# Patient Record
Sex: Male | Born: 1965 | Race: White | Hispanic: No | Marital: Married | Smoking: Former smoker
Health system: Southern US, Community
[De-identification: ages and names within clinical notes are randomized; demographics above are authoritative.]

## PROBLEM LIST (undated history)

## (undated) DIAGNOSIS — F32A Depression, unspecified: Secondary | ICD-10-CM

## (undated) DIAGNOSIS — E785 Hyperlipidemia, unspecified: Secondary | ICD-10-CM

## (undated) DIAGNOSIS — N209 Urinary calculus, unspecified: Secondary | ICD-10-CM

## (undated) DIAGNOSIS — I82409 Acute embolism and thrombosis of unspecified deep veins of unspecified lower extremity: Secondary | ICD-10-CM

## (undated) DIAGNOSIS — Z86711 Personal history of pulmonary embolism: Secondary | ICD-10-CM

## (undated) DIAGNOSIS — K769 Liver disease, unspecified: Secondary | ICD-10-CM

## (undated) DIAGNOSIS — Z87442 Personal history of urinary calculi: Secondary | ICD-10-CM

## (undated) DIAGNOSIS — M1990 Unspecified osteoarthritis, unspecified site: Secondary | ICD-10-CM

## (undated) DIAGNOSIS — K219 Gastro-esophageal reflux disease without esophagitis: Secondary | ICD-10-CM

## (undated) DIAGNOSIS — I739 Peripheral vascular disease, unspecified: Secondary | ICD-10-CM

## (undated) DIAGNOSIS — F329 Major depressive disorder, single episode, unspecified: Secondary | ICD-10-CM

## (undated) DIAGNOSIS — Z8719 Personal history of other diseases of the digestive system: Secondary | ICD-10-CM

## (undated) DIAGNOSIS — I10 Essential (primary) hypertension: Secondary | ICD-10-CM

## (undated) HISTORY — DX: Personal history of urinary calculi: Z87.442

## (undated) HISTORY — DX: Essential (primary) hypertension: I10

## (undated) HISTORY — PX: HEMORRHOID SURGERY: SHX153

## (undated) HISTORY — DX: Major depressive disorder, single episode, unspecified: F32.9

## (undated) HISTORY — PX: VASECTOMY: SHX75

## (undated) HISTORY — PX: APPENDECTOMY: SHX54

## (undated) HISTORY — PX: NASAL SINUS SURGERY: SHX719

## (undated) HISTORY — DX: Personal history of pulmonary embolism: Z86.711

## (undated) HISTORY — DX: Hyperlipidemia, unspecified: E78.5

## (undated) HISTORY — PX: HIP FRACTURE SURGERY: SHX118

## (undated) HISTORY — PX: HERNIA REPAIR: SHX51

## (undated) HISTORY — PX: COLONOSCOPY W/ POLYPECTOMY: SHX1380

## (undated) HISTORY — DX: Depression, unspecified: F32.A

## (undated) HISTORY — DX: Acute embolism and thrombosis of unspecified deep veins of unspecified lower extremity: I82.409

## (undated) HISTORY — DX: Liver disease, unspecified: K76.9

---

## 2011-10-08 ENCOUNTER — Ambulatory Visit: Payer: 59 | Admitting: Physical Medicine & Rehabilitation

## 2011-10-08 ENCOUNTER — Encounter: Payer: 59 | Attending: Physical Medicine & Rehabilitation

## 2012-10-02 ENCOUNTER — Telehealth: Payer: Self-pay | Admitting: Internal Medicine

## 2012-10-02 NOTE — Telephone Encounter (Signed)
S/W Larita Fife Case Manager in re NP appt 12/17 @ 9:30 w/Dr. Arbutus Ped.  Referring Dr. Venita Lick Dx- Hx of PE Welcome packet mailed.

## 2012-10-13 ENCOUNTER — Encounter: Payer: Self-pay | Admitting: Vascular Surgery

## 2012-10-16 ENCOUNTER — Telehealth: Payer: Self-pay | Admitting: Internal Medicine

## 2012-10-16 NOTE — Telephone Encounter (Signed)
C/D 10/16/12 for appt.10/17/12

## 2012-10-17 ENCOUNTER — Ambulatory Visit: Payer: 59

## 2012-10-17 ENCOUNTER — Ambulatory Visit (HOSPITAL_BASED_OUTPATIENT_CLINIC_OR_DEPARTMENT_OTHER): Payer: 59 | Admitting: Internal Medicine

## 2012-10-17 ENCOUNTER — Other Ambulatory Visit (HOSPITAL_BASED_OUTPATIENT_CLINIC_OR_DEPARTMENT_OTHER): Payer: PRIVATE HEALTH INSURANCE

## 2012-10-17 VITALS — BP 142/94 | HR 82 | Temp 97.8°F | Resp 22 | Ht 70.5 in | Wt 242.9 lb

## 2012-10-17 DIAGNOSIS — Z86718 Personal history of other venous thrombosis and embolism: Secondary | ICD-10-CM

## 2012-10-17 DIAGNOSIS — I2699 Other pulmonary embolism without acute cor pulmonale: Secondary | ICD-10-CM

## 2012-10-17 DIAGNOSIS — Z7901 Long term (current) use of anticoagulants: Secondary | ICD-10-CM

## 2012-10-17 DIAGNOSIS — Z86711 Personal history of pulmonary embolism: Secondary | ICD-10-CM

## 2012-10-17 LAB — CBC WITH DIFFERENTIAL/PLATELET
Basophils Absolute: 0 10*3/uL (ref 0.0–0.1)
EOS%: 3.8 % (ref 0.0–7.0)
HGB: 15.4 g/dL (ref 13.0–17.1)
LYMPH%: 31.7 % (ref 14.0–49.0)
MCH: 29.5 pg (ref 27.2–33.4)
MCV: 86.2 fL (ref 79.3–98.0)
MONO%: 10.5 % (ref 0.0–14.0)
Platelets: 229 10*3/uL (ref 140–400)
RDW: 14.2 % (ref 11.0–14.6)

## 2012-10-17 LAB — COMPREHENSIVE METABOLIC PANEL (CC13)
AST: 21 U/L (ref 5–34)
BUN: 15 mg/dL (ref 7.0–26.0)
CO2: 27 mEq/L (ref 22–29)
Calcium: 9.5 mg/dL (ref 8.4–10.4)
Chloride: 100 mEq/L (ref 98–107)
Creatinine: 1 mg/dL (ref 0.7–1.3)
Glucose: 91 mg/dl (ref 70–99)

## 2012-10-17 LAB — PROTIME-INR: INR: 1.4 — ABNORMAL LOW (ref 2.00–3.50)

## 2012-10-17 NOTE — Patient Instructions (Signed)
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 Recommendation regarding anticoagulation before and after surgery will be sent to your primary care physician and your orthopedic surgeon. Followup with your primary care physician as previously scheduled.

## 2012-10-17 NOTE — Progress Notes (Signed)
[facility] Telephone:([phone]) [phone]   [phone]  CONSULT NOTE  REFERRING PHYSICIAN: Dr. Ninetta Lights.  REASON FOR CONSULTATION: Patient with history of pulmonary embolism for preoperative evaluation before surgical intervention  HPI Kent Jones is a 46 y.o. male was past medical history significant for kidney stones, liver disease, dyslipidemia, depression, hypertension and history of pulmonary embolism in February of 2010 after right hip surgery. The patient mentions that in early 2010 had an accident and fell from a high ladder during work. He had significant trauma to his back as well as the right hip with an acetabular fracture requiring an ORIF. After his hip surgery the patient developed deep venous thrombosis with pulmonary embolism. He was treated with Lovenox followed by Coumadin for more than 18 months. His Coumadin was discontinued but 2 weeks later the patient developed deep venous thrombosis again and he was started on Coumadin again. He is currently on 4.5 mg by mouth daily and his PT/INR is regulated by his primary care physician. The patient is expected to have surgery to decompress the L5-S1 by Dr. Shon Baton. He was referred to me today for evaluation and recommendation regarding anticoagulation before his surgery. His wife mentions that he had a hypercoagulable workup performed at [facility] and this was unremarkable for any genetic or acquired hypercoagulable abnormality but I don't have any record of it. It was decided after the recurrent DVT that the patient will continue on Coumadin for life. He is also expected to see Dr. Arbie Cookey for evaluation and consideration of IVC filter placement before his surgery because of the high risk of deep venous thrombosis and pulmonary emboli. He is scheduled to see him the end of December 2013. The patient is feeling fine today with no specific complaints except for back pain and he is currently on  oxycodone. He denied having any significant weight loss or night sweats. He has no chest pain, shortness breath, cough or hemoptysis. He has no swelling of his lower extremities.   @SFHPI @  Past Medical History  Diagnosis Date  . DVT (deep venous thrombosis)   . Depression   . Hypertension   . Hyperlipidemia   . History of kidney stones   . Liver disease   . Personal history of PE (pulmonary embolism)     Past Surgical History  Procedure Date  . Nasal sinus surgery   . Vasectomy   . Hip fracture surgery     Right acetabular fracture  . Appendectomy   . Colonoscopy w/ polypectomy   . Hemorrhoid surgery     Family History  Problem Relation Age of Onset  . Diabetes Mother   . COPD Father   . Heart disease Father   . Diabetes Father   . Liver disease Father     Social History History  Substance Use Topics  . Smoking status: Former Smoker    Quit date: 10/13/1997  . Smokeless tobacco: Current User    Types: Chew  . Alcohol Use: 3.0 oz/week    5 Cans of beer per week    Allergies no known allergies  Current Outpatient Prescriptions  Medication Sig Dispense Refill  . oxyCODONE (ROXICODONE) 15 MG immediate release tablet Take 15 mg by mouth 2 (two) times daily.      . rosuvastatin (CRESTOR) 10 MG tablet Take 10 mg by mouth daily.      Marland Kitchen venlafaxine (EFFEXOR) 75 MG tablet Take 75 mg by mouth 2 (two) times  daily.      . Warfarin Sodium (COUMADIN PO) Take by mouth.        Review of Systems  A comprehensive review of systems was negative except for: Musculoskeletal: positive for back pain  Physical Exam  ZOX:WRUEA, healthy, no distress, well nourished and well developed SKIN: skin color, texture, turgor are normal HEAD: Normocephalic, No masses, lesions, tenderness or abnormalities EYES: normal, PERRLA EARS: External ears normal OROPHARYNX:no exudate and no erythema  NECK: supple, no adenopathy LYMPH:  no palpable lymphadenopathy, no  hepatosplenomegaly LUNGS: clear to auscultation  HEART: regular rate & rhythm, no murmurs and no gallops ABDOMEN:abdomen soft and non-tender BACK: Mild tenderness to palpation in the lower back EXTREMITIES:no joint deformities, effusion, or inflammation, no edema, no skin discoloration, no clubbing  NEURO: alert & oriented x 3 with fluent speech, no focal motor/sensory deficits  PERFORMANCE STATUS: ECOG 1  LABORATORY DATA: Lab Results  Component Value Date   WBC 7.7 10/17/2012   HGB 15.4 10/17/2012   HCT 45.2 10/17/2012   MCV 86.2 10/17/2012   PLT 229 10/17/2012      Chemistry      Component Value Date/Time   NA 139 10/17/2012 0937   K 4.0 10/17/2012 0937   CL 100 10/17/2012 0937   CO2 27 10/17/2012 0937   BUN 15.0 10/17/2012 0937   CREATININE 1.0 10/17/2012 0937      Component Value Date/Time   CALCIUM 9.5 10/17/2012 0937   ALKPHOS 79 10/17/2012 0937   AST 21 10/17/2012 0937   ALT 30 10/17/2012 0937   BILITOT 0.66 10/17/2012 0937       RADIOGRAPHIC STUDIES: No results found.  ASSESSMENT: This is a very pleasant 46 years old white male with history of deep venous thrombosis as well as pulmonary emboli and currently on Coumadin for life because of the recurrent deep venous thrombosis. The patient is doing fine today with no specific complaints but his PTI/INR is subtherapeutic. He supposed to undergo back surgery in early January of 2004 and he is here today for recommendation regarding anticoagulation before and after his surgery.  PLAN: I had a lengthy discussion with the patient and his wife today about his condition. Although there is no indication to put the patient on Coumadin for life because of the absence of any hypercoagulable abnormality and his recurrent deep venous thrombosis happened when he was off Coumadin, but the patient is still at high risk for recurrent deep venous thrombosis and pulmonary emboli because of his extensive bone surgery.  I would agree  with continuing his anticoagulation for prolonged periods and probably for life unless IVC filter is placed which will not protect the patient from developing deep venous thrombosis but at least it will help preventing pulmonary embolus.  For the perioperative anticoagulation I would recommend to stop the Coumadin at least 5 days before the surgery and bridge the patient with Lovenox for 3 days before stopping 48 hours before the surgical procedure. The patient can resume his Lovenox within 24-48 hours after the procedure and Coumadin can be restarted one day later.  I discussed my recommendation with the patient and his wife. They are in agreement with the current plan. I gave the patient and his wife the time to ask questions and I answered them completely to their satisfaction. I don't see a need for the patient to continue routine followup visit with me at this point but will be happy to see him in the future if needed.  All questions were answered. The patient knows to call the clinic with any problems, questions or concerns. We can certainly see the patient much sooner if necessary.  Thank you so much for allowing me to participate in the care of Kent Jones. I will continue to follow up the patient with you and assist in his care.  I spent 30 minutes counseling the patient face to face. The total time spent in the appointment was 55 minutes.  Shawntia Mangal K. 10/17/2012, 11:15 AM

## 2012-10-30 ENCOUNTER — Encounter: Payer: Self-pay | Admitting: Vascular Surgery

## 2012-10-31 ENCOUNTER — Encounter: Payer: Self-pay | Admitting: Vascular Surgery

## 2012-10-31 ENCOUNTER — Ambulatory Visit (INDEPENDENT_AMBULATORY_CARE_PROVIDER_SITE_OTHER): Payer: PRIVATE HEALTH INSURANCE | Admitting: Vascular Surgery

## 2012-10-31 VITALS — BP 127/91 | HR 89 | Resp 18 | Ht 70.0 in | Wt 244.5 lb

## 2012-10-31 DIAGNOSIS — Z95828 Presence of other vascular implants and grafts: Secondary | ICD-10-CM | POA: Insufficient documentation

## 2012-10-31 DIAGNOSIS — Z86711 Personal history of pulmonary embolism: Secondary | ICD-10-CM | POA: Insufficient documentation

## 2012-10-31 NOTE — Progress Notes (Signed)
[facility]ist [facility]   Patient name: Kent Jones MRN: [id number] DOB: [birth year] Sex: male   Referred by: Shon Baton  Reason for referral:  Chief Complaint  Patient presents with  . New Evaluation    evaluation for filter placement,  history of PE    HISTORY OF PRESENT ILLNESS: The patient presents today for consideration of a vena cava filter placement prior to upcoming back surgery. He has a very complex prior history. He does have a back injury with recommendation for surgical correction. 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 I do not have his duplex from this time but he and his wife reports that he had bilateral DVT and also pulmonary embolus. He was continued on Coumadin for some time and then after discontinuation of his Coumadin had a second DVT in his left leg. This was her confirmed with duplex. Again I do not have these records for review. He has been told that he needs to be on lifelong Coumadin following this. He apparently did have an extensive hypercoagulable workup which did not show any specific factor causing his DVT and pulmonary embolus. I have reviewed his recent consult from hematology with Dr. Gwenyth Bouillon as well. He does not have any history of cardiac disease or premature atherosclerotic disease. He does not have a family history of premature atherosclerotic disease or DVT.  Past Medical History  Diagnosis Date  . DVT (deep venous thrombosis)   . Depression   . Hypertension   . Hyperlipidemia   . History of kidney stones   . Liver disease   . Personal history of PE (pulmonary embolism)     Past Surgical History  Procedure Date  . Nasal sinus surgery   . Vasectomy   . Hip fracture surgery     Right acetabular fracture  . Appendectomy   . Colonoscopy w/ polypectomy   . Hemorrhoid surgery     History   Social History  . Marital Status: Married    Spouse Name: N/A    Number of Children: N/A  . Years of  Education: N/A   Occupational History  . Not on file.   Social History Main Topics  . Smoking status: Former Smoker    Quit date: 10/13/1997  . Smokeless tobacco: Current User    Types: Chew  . Alcohol Use: 3.0 oz/week    5 Cans of beer per week  . Drug Use: No  . Sexually Active: Not on file   Other Topics Concern  . Not on file   Social History Narrative  . No narrative on file    Family History  Problem Relation Age of Onset  . Diabetes Mother   . COPD Father   . Heart disease Father   . Diabetes Father   . Liver disease Father   . Hyperlipidemia Father   . Hypertension Father   . Heart attack Father     Allergies as of 10/31/2012 - Review Complete 10/31/2012  Allergen Reaction Noted  . Penicillins Hives 10/31/2012    Current Outpatient Prescriptions on File Prior to Visit  Medication Sig Dispense Refill  . dexlansoprazole (DEXILANT) 60 MG capsule Take 60 mg by mouth daily.      Marland Kitchen oxyCODONE (ROXICODONE) 15 MG immediate release tablet Take 15 mg by mouth.       . rosuvastatin (CRESTOR) 10 MG tablet Take 10 mg by mouth daily.      . TESTOSTERONE IM  Inject into the muscle.      . Valsartan (DIOVAN PO) Take by mouth daily.      Marland Kitchen venlafaxine (EFFEXOR) 75 MG tablet Take 75 mg by mouth 2 (two) times daily.      . Warfarin Sodium (COUMADIN PO) Take by mouth.         REVIEW OF SYSTEMS:  Positives indicated with an "X"  CARDIOVASCULAR:  [ ]  chest pain   [ ]  chest pressure   [ ]  palpitations   [ ]  orthopnea   [x ] dyspnea on exertion   [ ]  claudication   [ ]  rest pain   xDVT   [ ]  phlebitis PULMONARY:   [ ]  productive cough   [ ]  asthma   [ ]  wheezing NEUROLOGIC:   [x ] weakness  [x ] paresthesias  [ ]  aphasia  [ ]  amaurosis  [ ]  dizziness HEMATOLOGIC:   [ ]  bleeding problems   [ ]  clotting disorders MUSCULOSKELETAL:  [ ]  joint pain   [ ]  joint swelling GASTROINTESTINAL: [x ]  blood in stool  [ ]   hematemesis GENITOURINARY:  [ ]   dysuria  [ ]    hematuria PSYCHIATRIC:  [x ] history of major depression INTEGUMENTARY:  [ ]  rashes  [ ]  ulcers CONSTITUTIONAL:  [ ]  fever   [ ]  chills  PHYSICAL EXAMINATION:  General: The patient is a well-nourished male, in no acute distress. Vital signs are BP 127/91  Pulse 89  Resp 18  Ht 5\' 10"  (1.778 m)  Wt 244 lb 8 oz (110.904 kg)  BMI 35.08 kg/m2 Pulmonary: There is a good air exchange bilaterally without wheezing or rales. Abdomen: Soft and non-tender with normal pitch bowel sounds. Musculoskeletal: There are no major deformities.  There is no significant extremity pain. Neurologic: No focal weakness or paresthesias are detected, Skin: There are no ulcer or rashes noted. Psychiatric: The patient has normal affect. Cardiovascular: There is a regular rate and rhythm without significant murmur appreciated. Pulse status 2+ radial and 2+ dorsalis pedis pulses bilaterally Lower surety show no evidence of changes of chronic venous hypertension    Impression and Plan:  Had a long discussion with the patient and his wife. Also I discussed this with his medical case manager following my discussion with the patient and his wife. I think there is no absolute indication for vena cava filter placement. This is similar to Dr. Sharlene Dory opinion that there is no absolute indication for lifelong Coumadin. Certainly is quite concerning with bilateral DVT and pulmonary embolus following hip surgery and subsequent DVT within several weeks of discontinuing his Coumadin. I feel he is at very high risk for DVT and potential pulmonary embolus around the time of any a prolonged immobility and prolonged surgery such as back surgery. I wouldn't recommend vena cava filter placement. I discussed the procedure including potential complications of vena caval occlusion and penetration of the cava with struts of the vena cava filter over time. I filled the benefit of a vena cava filter placement out way his risk. Regarding the  potential for removable filter versus lifelong filter, I would recommend permanent filter placement due to his ongoing risk for DVT and pulmonary embolus. He reports that he is to have a probable replacement of his right hip and 2-3 year time frame. We will coordinate this with Dr. Shon Baton. I feel that it would be acceptable to place the filter at the same time as his back surgery if Dr. Shon Baton cells  this is exceptable    Senita Corredor [facility] [place] Office: [phone]

## 2012-12-22 NOTE — H&P (Signed)
History of Present Illness The patient is a 47 year old male who presents today for follow up of their back. The patient is being followed for their central back pain. They are now 4 year(s) out from injury. The patient reports their current pain level to be 4 / 10. Note for "Follow-up back": discuss surgery  Subjective Transcription  He returns today for a preoperative evaluation. We have had an opportunity to review his 08-25-12 MRI which showed the ongoing spondylolisthesis at L5-S1. I did tell him that I did have the deposition with his attorney concerning the L5-S1 problem. At this point, he also has had an opportunity to meet with Dr. Arbie Cookey whom I have spoken to. Dr. Arbie Cookey feels as though permanent implantation of the filter device should be adequate and it can be done the same day as surgery.  Allergies No Known Allergies  Social History Marital status. married Living situation. live with spouse Illicit drug use. no Number of flights of stairs before winded. 2-3 Tobacco use. Former smoker. quit 15 years ago never smoker; uses 1 can(s) smokeless per week Tobacco / smoke exposure. no Pain Contract. yes Children. 3 Alcohol use. current drinker; drinks beer; less than 5 per week No alcohol use Current work status. disabled Exercise. Exercises weekly; does running / walking Drug/Alcohol Rehab (Previously). no Drug/Alcohol Rehab (Currently). no  Medication History Roxicodone  Past Surgical History Sinus Surgery Straighten Nasal Septum Vasectomy Hip Fracture and Surgery. right Appendectomy Colon Polyp Removal - Colonoscopy Hemorrhoidectomy  Other Problems Kidney Stone Liver disease Unspecified Diagnosis Hypercholesterolemia Blood Clot Depression High blood pressure  Objective Transcription  His clinical exam is essentially unchanged, he continues to have severe debilitating back,buttock, bilateral leg pain with the left side being  worse than the right. The left side does trace into the L5-S1 dermatome. He has difficulty with heel-toe ambulation because of his chronic pain. The neurological exam is somewhat diffuse trace weakness due to pelvic and femur fractures and chronic longstanding issues. The back pain is horrific with palpation and range of motion. Lungs are clear to auscultation. Heart RRR. Abdomen is soft and nontender. No history of incontinence of bowel and/or bladder.   Plans Transcription  At this point in time,I had a long discussion with the patient and his wife about surgical intervention. Dr. Arbie Cookey will do an implantation of a permanent MRI compatible filter and then he will be turned prone and he will do a posterior transforaminal lumbar interbody fusion of L5 to S1. There is a central disk protrusion at L4-5 but no significant instability.  As I indicated in my previous notes while there is some degenerative changes at L4-5 I do not think it is enough to warrant incorporation into the surgical plan. I will plan on doing the L5-S1 TLIF. He will be in the hospital for 3-5 days. He will require a walker or a cane, a three in one shower chair and a bedside commode. He may require a home health service (PT/ nurse) depending upon how he does during his hospitalization. My hope is that by 4-5 months post operative we may be able to entertain light duties. However, his overall success rate is somewhat guarded due to the multiple other issues we are dealing with, specifically the degenerative hip disease, his previous acetabular fracture as well as the hip fracture that he had. I do think he has a good chance of getting reduced pain control and an improved quality of life as a result of the  back operation. All of their questions were encouraged and addressed. We have also gone over the risks to include infection, bleeding, nerve damage, death, stroke, paralysis, bleeding, blood clots, leak of spinal fluid, need for further  surgery, adjacent segment degeneration.  Angelisse Riso D. Shon Baton, MD  Clearance obtained from Dr Kristen Loader

## 2012-12-27 ENCOUNTER — Other Ambulatory Visit: Payer: Self-pay

## 2013-01-01 ENCOUNTER — Encounter (HOSPITAL_COMMUNITY): Payer: Self-pay | Admitting: Pharmacy Technician

## 2013-01-03 ENCOUNTER — Encounter (HOSPITAL_COMMUNITY)
Admission: RE | Admit: 2013-01-03 | Discharge: 2013-01-03 | Disposition: A | Discharge status: Home / Self Care | Payer: Worker's Compensation | Source: Ambulatory Visit | Attending: Orthopedic Surgery | Admitting: Orthopedic Surgery

## 2013-01-03 ENCOUNTER — Encounter (HOSPITAL_COMMUNITY)
Admission: RE | Admit: 2013-01-03 | Discharge: 2013-01-03 | Disposition: A | Discharge status: Home / Self Care | Payer: Worker's Compensation | Source: Ambulatory Visit | Attending: Anesthesiology | Admitting: Anesthesiology

## 2013-01-03 ENCOUNTER — Encounter (HOSPITAL_COMMUNITY): Payer: Self-pay

## 2013-01-03 HISTORY — DX: Gastro-esophageal reflux disease without esophagitis: K21.9

## 2013-01-03 HISTORY — DX: Unspecified osteoarthritis, unspecified site: M19.90

## 2013-01-03 HISTORY — DX: Peripheral vascular disease, unspecified: I73.9

## 2013-01-03 HISTORY — DX: Urinary calculus, unspecified: N20.9

## 2013-01-03 HISTORY — DX: Personal history of other diseases of the digestive system: Z87.19

## 2013-01-03 LAB — CBC
MCH: 29.4 pg (ref 26.0–34.0)
MCHC: 35.1 g/dL (ref 30.0–36.0)
Platelets: 261 10*3/uL (ref 150–400)
RDW: 14.2 % (ref 11.5–15.5)

## 2013-01-03 LAB — HEPATIC FUNCTION PANEL
ALT: 49 U/L (ref 0–53)
AST: 37 U/L (ref 0–37)
Albumin: 4 g/dL (ref 3.5–5.2)
Alkaline Phosphatase: 64 U/L (ref 39–117)
Total Bilirubin: 0.4 mg/dL (ref 0.3–1.2)

## 2013-01-03 LAB — BASIC METABOLIC PANEL
BUN: 12 mg/dL (ref 6–23)
Calcium: 9.9 mg/dL (ref 8.4–10.5)
Creatinine, Ser: 0.88 mg/dL (ref 0.50–1.35)
GFR calc non Af Amer: >90 mL/min (ref >90)
Glucose, Bld: 97 mg/dL (ref 70–99)

## 2013-01-03 LAB — TYPE AND SCREEN: Antibody Screen: NEGATIVE

## 2013-01-03 LAB — PROTIME-INR: Prothrombin Time: 27.1 seconds — ABNORMAL HIGH (ref 11.6–15.2)

## 2013-01-03 NOTE — Progress Notes (Signed)
01/03/13 0947  OBSTRUCTIVE SLEEP APNEA  Score 4 or greater  Results sent to PCP

## 2013-01-03 NOTE — Progress Notes (Signed)
Consult with [employer] pa

## 2013-01-03 NOTE — Progress Notes (Signed)
Anesthesia PAT evaluation:  Patient is a 47 year old male scheduled for TLIF L5-S1 by Dr. Shon Baton on 01/10/13.  He is scheduled to have insertion of a permanent MRI compatible IVC filter in the OR by Dr. Arbie Cookey just prior to lumbar fusion.  (I confirmed this with Dr. Arbie Cookey and Dr. Shon Baton when I ran into them in the hallway today.)  History includes bilateral LE DVT and PE in 2010 following hip surgery for fracture (surgery done at [facility], but admission for DVT/PE was at [facility]), recurrent DVT when off Coumadin, former smoker, obesity, GERD, hiatal hernia, HTN, nephrolithiasis, depression, HLD, nasal sinus surgery, arthritis, and history of elevated LFTs in the past but no known diagnosis of hepatitis (they were WNL in 10/2012).  PCP is Dr. Ninetta Lights in [place], who has reportedly cleared patient for this procedure (note requested).  Dr. Shon Baton also had patient evaluated by Hematologist Dr. Arbutus Ped and Vascular Surgeon Dr. Arbie Cookey prior to scheduling surgery (see their notes in Epic).   EKG on 01/03/13 showed NSR, minimal voltage criteria for LVH.  Patient does not recall any history of prior cardiac testing such as stress or echo.  CXR on 01/03/13 showed: 1. Low lung volumes without radiographic evidence of acute cardiopulmonary disease.  Preoperative labs noted.  Plan to repeat PT/PTT on arrival.  LFTs WNL. Dr. Shon Baton aware of INR results and is planning to have his staff instruct paitent to hold his Coumadin following a half dose on 01/05/13.  Dr. Shon Baton plans to use a Lovenox bridge post-operatively.  Velna Ochs [facility]/Anesthesiology Phone [phone] 01/03/2013 3:34 PM

## 2013-01-03 NOTE — Pre-Procedure Instructions (Addendum)
Kent Jones.  01/03/2013   Your procedure is scheduled on:  01/10/13  Report to [facility] at 630 AM.  Call this number if you have problems the morning of surgery: [phone]   Remember:   Do not eat food or drink liquids after midnight.   Take these medicines the morning of surgery with A SIP OF WATER: dexilant, pain med, effexor  STOP coumadin per dr 01/06/13   Do not wear jewelry, make-up or nail polish.  Do not wear lotions, powders, or perfumes. You may wear deodorant.  Do not shave 48 hours prior to surgery. Men may shave face and neck.  Do not bring valuables to the hospital.  Contacts, dentures or bridgework may not be worn into surgery.  Leave suitcase in the car. After surgery it may be brought to your room.  For patients admitted to the hospital, checkout time is 11:00 AM the day of  discharge.   Patients discharged the day of surgery will not be allowed to drive  home.  Name and phone number of your driver:   Special Instructions: Shower using CHG 2 nights before surgery and the night before surgery.  If you shower the day of surgery use CHG.  Use special wash - you have one bottle of CHG for all showers.  You should use approximately 1/3 of the bottle for each shower.   Please read over the following fact sheets that you were given: Pain Booklet, Coughing and Deep Breathing, Blood Transfusion Information, MRSA Information and Surgical Site Infection Prevention

## 2013-01-09 MED ORDER — VANCOMYCIN HCL 10 G IV SOLR
1500.0000 mg | INTRAVENOUS | Status: AC
  Administered 2013-01-10: 1500 mg via INTRAVENOUS
  Filled 2013-01-09: qty 1500

## 2013-01-10 ENCOUNTER — Inpatient Hospital Stay (HOSPITAL_COMMUNITY): Payer: Worker's Compensation

## 2013-01-10 ENCOUNTER — Encounter (HOSPITAL_COMMUNITY)
Admission: RE | Disposition: A | Discharge status: Home Health | Payer: Self-pay | Source: Ambulatory Visit | Attending: Orthopedic Surgery

## 2013-01-10 ENCOUNTER — Encounter (HOSPITAL_COMMUNITY): Payer: Self-pay | Admitting: *Deleted

## 2013-01-10 ENCOUNTER — Encounter (HOSPITAL_COMMUNITY): Payer: Self-pay | Admitting: Vascular Surgery

## 2013-01-10 ENCOUNTER — Inpatient Hospital Stay (HOSPITAL_COMMUNITY)
Admission: RE | Admit: 2013-01-10 | Discharge: 2013-01-13 | DRG: 460 | Disposition: A | Discharge status: Home Health | Payer: Worker's Compensation | Source: Ambulatory Visit | Attending: Orthopedic Surgery | Admitting: Orthopedic Surgery

## 2013-01-10 ENCOUNTER — Ambulatory Visit (HOSPITAL_COMMUNITY): Payer: Worker's Compensation | Admitting: Anesthesiology

## 2013-01-10 DIAGNOSIS — I10 Essential (primary) hypertension: Secondary | ICD-10-CM | POA: Diagnosis present

## 2013-01-10 DIAGNOSIS — I8010 Phlebitis and thrombophlebitis of unspecified femoral vein: Secondary | ICD-10-CM

## 2013-01-10 DIAGNOSIS — Z79899 Other long term (current) drug therapy: Secondary | ICD-10-CM

## 2013-01-10 DIAGNOSIS — K219 Gastro-esophageal reflux disease without esophagitis: Secondary | ICD-10-CM | POA: Diagnosis present

## 2013-01-10 DIAGNOSIS — Z01818 Encounter for other preprocedural examination: Secondary | ICD-10-CM

## 2013-01-10 DIAGNOSIS — M51379 Other intervertebral disc degeneration, lumbosacral region without mention of lumbar back pain or lower extremity pain: Secondary | ICD-10-CM | POA: Diagnosis present

## 2013-01-10 DIAGNOSIS — Z86711 Personal history of pulmonary embolism: Secondary | ICD-10-CM

## 2013-01-10 DIAGNOSIS — Q762 Congenital spondylolisthesis: Principal | ICD-10-CM

## 2013-01-10 DIAGNOSIS — F329 Major depressive disorder, single episode, unspecified: Secondary | ICD-10-CM | POA: Diagnosis present

## 2013-01-10 DIAGNOSIS — Z87442 Personal history of urinary calculi: Secondary | ICD-10-CM

## 2013-01-10 DIAGNOSIS — Z01812 Encounter for preprocedural laboratory examination: Secondary | ICD-10-CM

## 2013-01-10 DIAGNOSIS — M5416 Radiculopathy, lumbar region: Secondary | ICD-10-CM

## 2013-01-10 DIAGNOSIS — E7800 Pure hypercholesterolemia, unspecified: Secondary | ICD-10-CM | POA: Diagnosis present

## 2013-01-10 DIAGNOSIS — F17200 Nicotine dependence, unspecified, uncomplicated: Secondary | ICD-10-CM | POA: Diagnosis present

## 2013-01-10 DIAGNOSIS — M5137 Other intervertebral disc degeneration, lumbosacral region: Secondary | ICD-10-CM | POA: Diagnosis present

## 2013-01-10 DIAGNOSIS — I739 Peripheral vascular disease, unspecified: Secondary | ICD-10-CM | POA: Diagnosis present

## 2013-01-10 DIAGNOSIS — K449 Diaphragmatic hernia without obstruction or gangrene: Secondary | ICD-10-CM | POA: Diagnosis present

## 2013-01-10 DIAGNOSIS — Z7901 Long term (current) use of anticoagulants: Secondary | ICD-10-CM

## 2013-01-10 DIAGNOSIS — Z86718 Personal history of other venous thrombosis and embolism: Secondary | ICD-10-CM

## 2013-01-10 DIAGNOSIS — F3289 Other specified depressive episodes: Secondary | ICD-10-CM | POA: Diagnosis present

## 2013-01-10 HISTORY — PX: VENA CAVA FILTER PLACEMENT: SHX1085

## 2013-01-10 LAB — CREATININE, SERUM
GFR calc Af Amer: >90 mL/min (ref >90)
GFR calc non Af Amer: >90 mL/min (ref >90)

## 2013-01-10 LAB — CBC
MCV: 82.5 fL (ref 78.0–100.0)
Platelets: 198 10*3/uL (ref 150–400)
RBC: 4.28 MIL/uL (ref 4.22–5.81)
RDW: 13.7 % (ref 11.5–15.5)
WBC: 10.5 10*3/uL (ref 4.0–10.5)

## 2013-01-10 SURGERY — POSTERIOR LUMBAR FUSION 1 LEVEL
Anesthesia: General | Site: Spine Lumbar | Wound class: Clean

## 2013-01-10 MED ORDER — METHOCARBAMOL 500 MG PO TABS
500.0000 mg | ORAL_TABLET | Freq: Four times a day (QID) | ORAL | Status: DC | PRN
  Administered 2013-01-10 – 2013-01-12 (×6): 500 mg via ORAL
  Filled 2013-01-10 (×5): qty 1

## 2013-01-10 MED ORDER — IOHEXOL 300 MG/ML  SOLN
INTRAMUSCULAR | Status: DC | PRN
  Administered 2013-01-10: 50 mL via INTRA_ARTERIAL

## 2013-01-10 MED ORDER — WARFARIN SODIUM 7.5 MG PO TABS
7.5000 mg | ORAL_TABLET | Freq: Once | ORAL | Status: DC
  Filled 2013-01-10: qty 1

## 2013-01-10 MED ORDER — OXYCODONE HCL 5 MG PO TABS
ORAL_TABLET | ORAL | Status: AC
  Filled 2013-01-10: qty 2

## 2013-01-10 MED ORDER — METHOCARBAMOL 500 MG PO TABS
ORAL_TABLET | ORAL | Status: AC
  Filled 2013-01-10: qty 1

## 2013-01-10 MED ORDER — DIPHENHYDRAMINE HCL 12.5 MG/5ML PO ELIX: 12.5000 mg | ORAL_SOLUTION | Freq: Four times a day (QID) | ORAL | Status: DC | PRN

## 2013-01-10 MED ORDER — MIDAZOLAM HCL 5 MG/5ML IJ SOLN
INTRAMUSCULAR | Status: DC | PRN
  Administered 2013-01-10: 2 mg via INTRAVENOUS

## 2013-01-10 MED ORDER — NALOXONE HCL 0.4 MG/ML IJ SOLN: 0.4000 mg | INTRAMUSCULAR | Status: DC | PRN

## 2013-01-10 MED ORDER — ACETAMINOPHEN 10 MG/ML IV SOLN
1000.0000 mg | Freq: Four times a day (QID) | INTRAVENOUS | Status: DC
  Administered 2013-01-10: 1000 mg via INTRAVENOUS
  Filled 2013-01-10 (×3): qty 100

## 2013-01-10 MED ORDER — MORPHINE SULFATE 2 MG/ML IJ SOLN: 1.0000 mg | INTRAMUSCULAR | Status: DC | PRN

## 2013-01-10 MED ORDER — ONDANSETRON HCL 4 MG/2ML IJ SOLN: 4.0000 mg | INTRAMUSCULAR | Status: DC | PRN

## 2013-01-10 MED ORDER — PANTOPRAZOLE SODIUM 40 MG PO TBEC
40.0000 mg | DELAYED_RELEASE_TABLET | Freq: Every day | ORAL | Status: DC
  Administered 2013-01-10 – 2013-01-13 (×4): 40 mg via ORAL
  Filled 2013-01-10 (×4): qty 1

## 2013-01-10 MED ORDER — DEXAMETHASONE SODIUM PHOSPHATE 4 MG/ML IJ SOLN
4.0000 mg | Freq: Four times a day (QID) | INTRAMUSCULAR | Status: DC
  Filled 2013-01-10 (×15): qty 1

## 2013-01-10 MED ORDER — ACETAMINOPHEN 10 MG/ML IV SOLN
1000.0000 mg | Freq: Once | INTRAVENOUS | Status: AC
  Administered 2013-01-10: 1000 mg via INTRAVENOUS
  Filled 2013-01-10: qty 100

## 2013-01-10 MED ORDER — ONDANSETRON HCL 4 MG/2ML IJ SOLN: 4.0000 mg | Freq: Four times a day (QID) | INTRAMUSCULAR | Status: DC | PRN

## 2013-01-10 MED ORDER — HEMOSTATIC AGENTS (NO CHARGE) OPTIME
TOPICAL | Status: DC | PRN
  Administered 2013-01-10: 1 via TOPICAL

## 2013-01-10 MED ORDER — VENLAFAXINE HCL 75 MG PO TABS
75.0000 mg | ORAL_TABLET | Freq: Two times a day (BID) | ORAL | Status: DC
Start: 2013-01-10 — End: 2013-01-13
  Administered 2013-01-10 – 2013-01-13 (×6): 75 mg via ORAL
  Filled 2013-01-10 (×7): qty 1

## 2013-01-10 MED ORDER — DIPHENHYDRAMINE HCL 50 MG/ML IJ SOLN: 12.5000 mg | Freq: Four times a day (QID) | INTRAMUSCULAR | Status: DC | PRN

## 2013-01-10 MED ORDER — HYDROMORPHONE HCL PF 1 MG/ML IJ SOLN
INTRAMUSCULAR | Status: AC
  Filled 2013-01-10: qty 1

## 2013-01-10 MED ORDER — PROPOFOL INFUSION 10 MG/ML OPTIME
INTRAVENOUS | Status: DC | PRN
  Administered 2013-01-10: 75 ug/kg/min via INTRAVENOUS

## 2013-01-10 MED ORDER — BUPIVACAINE-EPINEPHRINE PF 0.25-1:200000 % IJ SOLN
INTRAMUSCULAR | Status: DC | PRN
  Administered 2013-01-10: 10 mL

## 2013-01-10 MED ORDER — SUCCINYLCHOLINE CHLORIDE 20 MG/ML IJ SOLN
INTRAMUSCULAR | Status: DC | PRN
  Administered 2013-01-10: 120 mg via INTRAVENOUS

## 2013-01-10 MED ORDER — SODIUM CHLORIDE 0.9 % IR SOLN
Status: DC | PRN
  Administered 2013-01-10: 09:00:00

## 2013-01-10 MED ORDER — HYDROMORPHONE 0.3 MG/ML IV SOLN
INTRAVENOUS | Status: DC
  Administered 2013-01-10: 18:00:00 via INTRAVENOUS
  Administered 2013-01-10: 4.99 mg via INTRAVENOUS
  Administered 2013-01-11: 0.3 mg via INTRAVENOUS
  Administered 2013-01-11: 1.79 mg via INTRAVENOUS
  Administered 2013-01-11: 01:00:00 via INTRAVENOUS
  Filled 2013-01-10 (×3): qty 25

## 2013-01-10 MED ORDER — PROPOFOL INFUSION 10 MG/ML OPTIME: INTRAVENOUS | Status: DC | PRN

## 2013-01-10 MED ORDER — THROMBIN 20000 UNITS EX SOLR
CUTANEOUS | Status: AC
  Filled 2013-01-10: qty 20000

## 2013-01-10 MED ORDER — PHENYLEPHRINE HCL 10 MG/ML IJ SOLN
INTRAMUSCULAR | Status: DC | PRN
  Administered 2013-01-10: 120 ug via INTRAVENOUS
  Administered 2013-01-10: 80 ug via INTRAVENOUS

## 2013-01-10 MED ORDER — SODIUM CHLORIDE 0.9 % IJ SOLN: 3.0000 mL | INTRAMUSCULAR | Status: DC | PRN

## 2013-01-10 MED ORDER — HYDROMORPHONE HCL PF 1 MG/ML IJ SOLN
0.2500 mg | INTRAMUSCULAR | Status: DC | PRN
  Administered 2013-01-10 (×3): 0.5 mg via INTRAVENOUS

## 2013-01-10 MED ORDER — OXYCODONE HCL 5 MG PO TABS
10.0000 mg | ORAL_TABLET | ORAL | Status: DC | PRN
  Administered 2013-01-10 – 2013-01-11 (×2): 10 mg via ORAL
  Filled 2013-01-10: qty 2

## 2013-01-10 MED ORDER — MENTHOL 3 MG MT LOZG: 1.0000 | LOZENGE | OROMUCOSAL | Status: DC | PRN

## 2013-01-10 MED ORDER — PROPOFOL 10 MG/ML IV BOLUS
INTRAVENOUS | Status: DC | PRN
  Administered 2013-01-10: 200 mg via INTRAVENOUS

## 2013-01-10 MED ORDER — SODIUM CHLORIDE 0.9 % IV SOLN: 250.0000 mL | INTRAVENOUS | Status: DC

## 2013-01-10 MED ORDER — PHENOL 1.4 % MT LIQD: 1.0000 | OROMUCOSAL | Status: DC | PRN

## 2013-01-10 MED ORDER — OXYCODONE HCL 5 MG PO TABS: 5.0000 mg | ORAL_TABLET | Freq: Once | ORAL | Status: DC | PRN

## 2013-01-10 MED ORDER — SODIUM CHLORIDE 0.9 % IJ SOLN: 9.0000 mL | INTRAMUSCULAR | Status: DC | PRN

## 2013-01-10 MED ORDER — DEXAMETHASONE 4 MG PO TABS
4.0000 mg | ORAL_TABLET | Freq: Four times a day (QID) | ORAL | Status: DC
  Administered 2013-01-10 – 2013-01-13 (×11): 4 mg via ORAL
  Filled 2013-01-10 (×15): qty 1

## 2013-01-10 MED ORDER — ENOXAPARIN SODIUM 30 MG/0.3ML ~~LOC~~ SOLN
30.0000 mg | SUBCUTANEOUS | Status: DC
  Filled 2013-01-10: qty 0.3

## 2013-01-10 MED ORDER — WARFARIN SODIUM 5 MG PO TABS: 5.0000 mg | ORAL_TABLET | Freq: Every day | ORAL | Status: DC

## 2013-01-10 MED ORDER — ZOLPIDEM TARTRATE 5 MG PO TABS
5.0000 mg | ORAL_TABLET | Freq: Every evening | ORAL | Status: DC | PRN
  Administered 2013-01-11 – 2013-01-12 (×2): 5 mg via ORAL
  Filled 2013-01-10 (×2): qty 1

## 2013-01-10 MED ORDER — ATORVASTATIN CALCIUM 10 MG PO TABS
10.0000 mg | ORAL_TABLET | Freq: Every day | ORAL | Status: DC
  Administered 2013-01-10 – 2013-01-12 (×3): 10 mg via ORAL
  Filled 2013-01-10 (×4): qty 1

## 2013-01-10 MED ORDER — WARFARIN - PHYSICIAN DOSING INPATIENT: Freq: Every day | Status: DC

## 2013-01-10 MED ORDER — DEXAMETHASONE SODIUM PHOSPHATE 4 MG/ML IJ SOLN
4.0000 mg | Freq: Once | INTRAMUSCULAR | Status: AC
  Administered 2013-01-10: 4 mg via INTRAVENOUS
  Filled 2013-01-10 (×2): qty 1

## 2013-01-10 MED ORDER — LIDOCAINE HCL (CARDIAC) 20 MG/ML IV SOLN
INTRAVENOUS | Status: DC | PRN
  Administered 2013-01-10: 100 mg via INTRAVENOUS

## 2013-01-10 MED ORDER — ARTIFICIAL TEARS OP OINT
TOPICAL_OINTMENT | OPHTHALMIC | Status: DC | PRN
  Administered 2013-01-10: 1 via OPHTHALMIC

## 2013-01-10 MED ORDER — PHENYLEPHRINE HCL 10 MG/ML IJ SOLN
10.0000 mg | INTRAVENOUS | Status: DC | PRN
  Administered 2013-01-10: 40 ug/min via INTRAVENOUS

## 2013-01-10 MED ORDER — THROMBIN 20000 UNITS EX SOLR
CUTANEOUS | Status: DC | PRN
  Administered 2013-01-10: 10:00:00 via TOPICAL

## 2013-01-10 MED ORDER — SODIUM CHLORIDE 0.9 % IV SOLN
INTRAVENOUS | Status: DC | PRN
  Administered 2013-01-10: 09:00:00 via INTRAVENOUS

## 2013-01-10 MED ORDER — BUPIVACAINE-EPINEPHRINE 0.25% -1:200000 IJ SOLN
INTRAMUSCULAR | Status: AC
  Filled 2013-01-10: qty 1

## 2013-01-10 MED ORDER — METHOCARBAMOL 100 MG/ML IJ SOLN
500.0000 mg | Freq: Four times a day (QID) | INTRAMUSCULAR | Status: DC | PRN
  Filled 2013-01-10: qty 5

## 2013-01-10 MED ORDER — METOCLOPRAMIDE HCL 5 MG/ML IJ SOLN
INTRAMUSCULAR | Status: DC | PRN
  Administered 2013-01-10: 10 mg via INTRAVENOUS

## 2013-01-10 MED ORDER — MORPHINE SULFATE (PF) 1 MG/ML IV SOLN
INTRAVENOUS | Status: AC
  Filled 2013-01-10: qty 25

## 2013-01-10 MED ORDER — ENOXAPARIN SODIUM 120 MG/0.8ML ~~LOC~~ SOLN
110.0000 mg | Freq: Two times a day (BID) | SUBCUTANEOUS | Status: DC
  Filled 2013-01-10 (×2): qty 0.8

## 2013-01-10 MED ORDER — MORPHINE SULFATE (PF) 1 MG/ML IV SOLN
INTRAVENOUS | Status: DC
  Administered 2013-01-10: 15:00:00 via INTRAVENOUS

## 2013-01-10 MED ORDER — LACTATED RINGERS IV SOLN
INTRAVENOUS | Status: DC
  Administered 2013-01-10 (×2): via INTRAVENOUS

## 2013-01-10 MED ORDER — LACTATED RINGERS IV SOLN
INTRAVENOUS | Status: DC | PRN
  Administered 2013-01-10 (×4): via INTRAVENOUS

## 2013-01-10 MED ORDER — SODIUM CHLORIDE 0.9 % IJ SOLN
3.0000 mL | Freq: Two times a day (BID) | INTRAMUSCULAR | Status: DC
  Administered 2013-01-10 – 2013-01-12 (×3): 3 mL via INTRAVENOUS

## 2013-01-10 MED ORDER — LIDOCAINE HCL 4 % MT SOLN
OROMUCOSAL | Status: DC | PRN
  Administered 2013-01-10: 4 mL via TOPICAL

## 2013-01-10 MED ORDER — OXYCODONE HCL 5 MG/5ML PO SOLN: 5.0000 mg | Freq: Once | ORAL | Status: DC | PRN

## 2013-01-10 MED ORDER — SUFENTANIL CITRATE 50 MCG/ML IV SOLN
INTRAVENOUS | Status: DC | PRN
  Administered 2013-01-10 (×3): 10 ug via INTRAVENOUS
  Administered 2013-01-10: 35 ug via INTRAVENOUS
  Administered 2013-01-10 (×3): 10 ug via INTRAVENOUS
  Administered 2013-01-10: 15 ug via INTRAVENOUS
  Administered 2013-01-10: 5 ug via INTRAVENOUS
  Administered 2013-01-10: 10 ug via INTRAVENOUS
  Administered 2013-01-10: 5 ug via INTRAVENOUS

## 2013-01-10 MED ORDER — 0.9 % SODIUM CHLORIDE (POUR BTL) OPTIME
TOPICAL | Status: DC | PRN
  Administered 2013-01-10 (×3): 1000 mL

## 2013-01-10 MED ORDER — METOCLOPRAMIDE HCL 5 MG/ML IJ SOLN: 10.0000 mg | Freq: Once | INTRAMUSCULAR | Status: DC | PRN

## 2013-01-10 SURGICAL SUPPLY — 110 items
BAG DECANTER FOR FLEXI CONT (MISCELLANEOUS) ×3 IMPLANT
BLADE SURG 11 STRL SS (BLADE) ×3 IMPLANT
BLADE SURG ROTATE 9660 (MISCELLANEOUS) ×3 IMPLANT
BUR EGG ELITE 4.0 (BURR) ×3 IMPLANT
CAGE TLIF XLRG 9 LUMBAR (Cage) ×3 IMPLANT
CLIP LIGATING EXTRA MED SLVR (CLIP) IMPLANT
CLIP LIGATING EXTRA SM BLUE (MISCELLANEOUS) IMPLANT
CLOTH BEACON ORANGE TIMEOUT ST (SAFETY) ×6 IMPLANT
CLSR STERI-STRIP ANTIMIC 1/2X4 (GAUZE/BANDAGES/DRESSINGS) ×3 IMPLANT
CORDS BIPOLAR (ELECTRODE) ×3 IMPLANT
COVER MAYO STAND STRL (DRAPES) ×6 IMPLANT
COVER PROBE W GEL 5X96 (DRAPES) ×3 IMPLANT
COVER SURGICAL LIGHT HANDLE (MISCELLANEOUS) ×6 IMPLANT
COVER TABLE BACK 60X90 (DRAPES) ×3 IMPLANT
DERMABOND ADVANCED (GAUZE/BANDAGES/DRESSINGS) ×1
DERMABOND ADVANCED .7 DNX12 (GAUZE/BANDAGES/DRESSINGS) ×2 IMPLANT
DRAPE C-ARM 42X72 X-RAY (DRAPES) ×6 IMPLANT
DRAPE CHEST BREAST 15X10 FENES (DRAPES) ×3 IMPLANT
DRAPE LAPAROTOMY T 102X78X121 (DRAPES) ×3 IMPLANT
DRAPE ORTHO SPLIT 77X108 STRL (DRAPES) ×1
DRAPE POUCH INSTRU U-SHP 10X18 (DRAPES) ×3 IMPLANT
DRAPE SURG 17X23 STRL (DRAPES) ×3 IMPLANT
DRAPE SURG ORHT 6 SPLT 77X108 (DRAPES) ×2 IMPLANT
DRAPE U-SHAPE 47X51 STRL (DRAPES) ×3 IMPLANT
DRSG MEPILEX BORDER 4X4 (GAUZE/BANDAGES/DRESSINGS) ×6 IMPLANT
DRSG MEPILEX BORDER 4X8 (GAUZE/BANDAGES/DRESSINGS) ×3 IMPLANT
DURAPREP 26ML APPLICATOR (WOUND CARE) ×3 IMPLANT
ELECT BLADE 4.0 EZ CLEAN MEGAD (MISCELLANEOUS)
ELECT BLADE 6.5 EXT (BLADE) ×3 IMPLANT
ELECT REM PT RETURN 9FT ADLT (ELECTROSURGICAL) ×3
ELECTRODE BLDE 4.0 EZ CLN MEGD (MISCELLANEOUS) IMPLANT
ELECTRODE REM PT RTRN 9FT ADLT (ELECTROSURGICAL) ×2 IMPLANT
FILTER VC CELECT-FEMORAL (Filter) ×3 IMPLANT
GAUZE SPONGE 4X4 16PLY XRAY LF (GAUZE/BANDAGES/DRESSINGS) IMPLANT
GLOVE BIO SURGEON STRL SZ7.5 (GLOVE) ×3 IMPLANT
GLOVE BIOGEL PI IND STRL 6.5 (GLOVE) ×6 IMPLANT
GLOVE BIOGEL PI IND STRL 7.0 (GLOVE) ×2 IMPLANT
GLOVE BIOGEL PI IND STRL 7.5 (GLOVE) ×2 IMPLANT
GLOVE BIOGEL PI IND STRL 8.5 (GLOVE) ×2 IMPLANT
GLOVE BIOGEL PI INDICATOR 6.5 (GLOVE) ×3
GLOVE BIOGEL PI INDICATOR 7.0 (GLOVE) ×1
GLOVE BIOGEL PI INDICATOR 7.5 (GLOVE) ×1
GLOVE BIOGEL PI INDICATOR 8.5 (GLOVE) ×1
GLOVE ECLIPSE 6.0 STRL STRAW (GLOVE) ×3 IMPLANT
GLOVE ECLIPSE 6.5 STRL STRAW (GLOVE) ×3 IMPLANT
GLOVE ECLIPSE 7.5 STRL STRAW (GLOVE) ×3 IMPLANT
GLOVE ECLIPSE 8.5 STRL (GLOVE) ×3 IMPLANT
GLOVE SS BIOGEL STRL SZ 7.5 (GLOVE) ×2 IMPLANT
GLOVE SUPERSENSE BIOGEL SZ 7.5 (GLOVE) ×1
GLOVE SURG SS PI 7.0 STRL IVOR (GLOVE) ×3 IMPLANT
GOWN PREVENTION PLUS XXLARGE (GOWN DISPOSABLE) ×3 IMPLANT
GOWN STRL NON-REIN LRG LVL3 (GOWN DISPOSABLE) ×6 IMPLANT
GUIDEWIRE 1.6X480MM (WIRE) ×6 IMPLANT
IV CATH 14GX2 1/4 (CATHETERS) IMPLANT
KIT BASIN OR (CUSTOM PROCEDURE TRAY) ×6 IMPLANT
KIT DILATOR XLIF 5 (KITS) ×2 IMPLANT
KIT NEEDLE NVM5 EMG ELECT (KITS) ×2 IMPLANT
KIT NEEDLE NVM5 EMG ELECTRODE (KITS) ×1
KIT POSITION SURG JACKSON T1 (MISCELLANEOUS) ×3 IMPLANT
KIT ROOM TURNOVER OR (KITS) ×6 IMPLANT
KIT XLIF (KITS) ×1
NEEDLE 22X1 1/2 (OR ONLY) (NEEDLE) ×6 IMPLANT
NEEDLE HYPO 25GX1X1/2 BEV (NEEDLE) IMPLANT
NEEDLE I-PASS III (NEEDLE) ×3 IMPLANT
NEEDLE PERC 18GX7CM (NEEDLE) ×3 IMPLANT
NEEDLE SPNL 18GX3.5 QUINCKE PK (NEEDLE) ×3 IMPLANT
NS IRRIG 1000ML POUR BTL (IV SOLUTION) ×3 IMPLANT
PACK LAMINECTOMY ORTHO (CUSTOM PROCEDURE TRAY) ×3 IMPLANT
PACK SURGICAL SETUP 50X90 (CUSTOM PROCEDURE TRAY) ×3 IMPLANT
PACK UNIVERSAL I (CUSTOM PROCEDURE TRAY) ×3 IMPLANT
PAD ARMBOARD 7.5X6 YLW CONV (MISCELLANEOUS) ×12 IMPLANT
PATTIES SURGICAL .5 X.5 (GAUZE/BANDAGES/DRESSINGS) IMPLANT
PATTIES SURGICAL .5 X1 (DISPOSABLE) ×6 IMPLANT
PROBE BALL TIP NVM5 SNG USE (BALLOONS) ×3 IMPLANT
ROD 45MM (Rod) ×1 IMPLANT
ROD MATRIX MIS 45MM (Rod) ×3 IMPLANT
ROD SPNL CVD 45X5.5XHRD NS (Rod) ×2 IMPLANT
SCREW MATRIX MIS 6.0X35MM (Screw) ×6 IMPLANT
SCREW MATRIX MIS 6.0X40MM (Screw) ×6 IMPLANT
SPONGE GAUZE 4X4 12PLY (GAUZE/BANDAGES/DRESSINGS) ×3 IMPLANT
SPONGE LAP 18X18 X RAY DECT (DISPOSABLE) ×3 IMPLANT
SPONGE LAP 4X18 X RAY DECT (DISPOSABLE) ×6 IMPLANT
SPONGE SURGIFOAM ABS GEL 100 (HEMOSTASIS) ×3 IMPLANT
STRIP CLOSURE SKIN 1/2X4 (GAUZE/BANDAGES/DRESSINGS) ×3 IMPLANT
SURGIFLO TRUKIT (HEMOSTASIS) IMPLANT
SURGIFLO W/THROMBIN 8M KIT (HEMOSTASIS) ×3 IMPLANT
SUT ETHILON 3 0 FSL (SUTURE) ×3 IMPLANT
SUT ETHILON 3 0 PS 1 (SUTURE) IMPLANT
SUT MNCRL AB 3-0 PS2 18 (SUTURE) ×6 IMPLANT
SUT VIC AB 0 CT1 27 (SUTURE) ×1
SUT VIC AB 0 CT1 27XBRD ANBCTR (SUTURE) ×2 IMPLANT
SUT VIC AB 1 CT1 27 (SUTURE) ×2
SUT VIC AB 1 CT1 27XBRD ANBCTR (SUTURE) ×4 IMPLANT
SUT VIC AB 2-0 CT1 18 (SUTURE) ×3 IMPLANT
SUT VICRYL 0 UR6 27IN ABS (SUTURE) ×6 IMPLANT
SUT VICRYL 4-0 PS2 18IN ABS (SUTURE) ×3 IMPLANT
SYR 20CC LL (SYRINGE) ×3 IMPLANT
SYR 20ML ECCENTRIC (SYRINGE) ×6 IMPLANT
SYR 30ML LL (SYRINGE) ×3 IMPLANT
SYR 5ML LL (SYRINGE) ×6 IMPLANT
SYR BULB IRRIGATION 50ML (SYRINGE) ×3 IMPLANT
SYR CONTROL 10ML LL (SYRINGE) ×3 IMPLANT
TAP CANN 5MM (TAP) ×3 IMPLANT
TAPE CLOTH SURG 4X10 WHT LF (GAUZE/BANDAGES/DRESSINGS) ×3 IMPLANT
TOWEL OR 17X24 6PK STRL BLUE (TOWEL DISPOSABLE) ×6 IMPLANT
TOWEL OR 17X26 10 PK STRL BLUE (TOWEL DISPOSABLE) ×6 IMPLANT
TRAY FOLEY CATH 14FR (SET/KITS/TRAYS/PACK) ×3 IMPLANT
WATER STERILE IRR 1000ML POUR (IV SOLUTION) ×3 IMPLANT
WIRE J 3MM .035X145CM (WIRE) ×3 IMPLANT
YANKAUER SUCT BULB TIP NO VENT (SUCTIONS) ×3 IMPLANT

## 2013-01-10 NOTE — H&P (Signed)
Kent Jones.  10/31/2012 11:00 AM   Office Visit  MRN:  [id number]   Description: 47 year old male  Provider: Larina Earthly, MD  Department: [facility]        Diagnoses    H/O pulmonary thrombosis    -  Primary    V12.55      Reason for Visit    New Evaluation    evaluation for filter placement,  history of PE        Current Vitals - Last Recorded    BP Pulse Resp Ht Wt BMI    127/91 89 18 5\' 10"  (1.778 m) 244 lb 8 oz (110.904 kg) 35.08 kg/m2       Progress Notes    Larina Earthly, MD at 10/31/2012  6:18 PM    Status: Signed                   [facility] [place]     Patient name: Kent Jones MRN: [id number]        DOB: 1966/05/09            Sex: male     Referred by: Shon Baton   Reason for referral:  Chief Complaint   Patient presents with   .  New Evaluation       evaluation for filter placement,  history of PE        HISTORY OF PRESENT ILLNESS: The patient presents today for consideration of a vena cava filter placement prior to upcoming back surgery. He has a very complex prior history. He does have a back injury with recommendation for surgical correction. 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 I do not have his duplex from this time but he and his wife reports that he had bilateral DVT and also pulmonary embolus. He was continued on Coumadin for some time and then after discontinuation of his Coumadin had a second DVT in his left leg. This was her confirmed with duplex. Again I do not have these records for review. He has been told that he needs to be on lifelong Coumadin following this. He apparently did have an extensive hypercoagulable workup which did not show any specific factor causing his DVT and pulmonary embolus. I have reviewed his recent consult from hematology with Dr. Gwenyth Bouillon as well. He does not have any history of cardiac disease or premature atherosclerotic disease. He does not  have a family history of premature atherosclerotic disease or DVT.    Past Medical History   Diagnosis  Date   .  DVT (deep venous thrombosis)     .  Depression     .  Hypertension     .  Hyperlipidemia     .  History of kidney stones     .  Liver disease     .  Personal history of PE (pulmonary embolism)           Past Surgical History   Procedure  Date   .  Nasal sinus surgery     .  Vasectomy     .  Hip fracture surgery         Right acetabular fracture   .  Appendectomy     .  Colonoscopy w/ polypectomy     .  Hemorrhoid surgery           History  Social History   .  Marital Status:  Married       Spouse Name:  N/A       Number of Children:  N/A   .  Years of Education:  N/A       Occupational History   .  Not on file.       Social History Main Topics   .  Smoking status:  Former Smoker       Quit date:  10/13/1997   .  Smokeless tobacco:  Current User       Types:  Chew   .  Alcohol Use:  3.0 oz/week       5 Cans of beer per week   .  Drug Use:  No   .  Sexually Active:  Not on file       Other Topics  Concern   .  Not on file       Social History Narrative   .  No narrative on file         Family History   Problem  Relation  Age of Onset   .  Diabetes  Mother     .  COPD  Father     .  Heart disease  Father     .  Diabetes  Father     .  Liver disease  Father     .  Hyperlipidemia  Father     .  Hypertension  Father     .  Heart attack  Father           Allergies as of 10/31/2012 - Review Complete 10/31/2012   Allergen  Reaction  Noted   .  Penicillins  Hives  10/31/2012         Current Outpatient Prescriptions on File Prior to Visit   Medication  Sig  Dispense  Refill   .  dexlansoprazole (DEXILANT) 60 MG capsule  Take 60 mg by mouth daily.         Marland Kitchen  oxyCODONE (ROXICODONE) 15 MG immediate release tablet  Take 15 mg by mouth.          .  rosuvastatin (CRESTOR) 10 MG tablet  Take 10 mg by mouth daily.         .   TESTOSTERONE IM  Inject into the muscle.         .  Valsartan (DIOVAN PO)  Take by mouth daily.         Marland Kitchen  venlafaxine (EFFEXOR) 75 MG tablet  Take 75 mg by mouth 2 (two) times daily.         .  Warfarin Sodium (COUMADIN PO)  Take by mouth.                REVIEW OF SYSTEMS:   Positives indicated with an "X"   CARDIOVASCULAR:  [ ]  chest pain   [ ]  chest pressure   [ ]  palpitations   [ ]  orthopnea               [x ] dyspnea on exertion   [ ]  claudication   [ ]  rest pain   xDVT   [ ]  phlebitis PULMONARY:   [ ]  productive cough   [ ]  asthma   [ ]  wheezing NEUROLOGIC:   [x ] weakness  [x ] paresthesias  [ ]  aphasia  [ ]  amaurosis  [ ]  dizziness HEMATOLOGIC:   [ ]  bleeding problems   [ ]   clotting disorders MUSCULOSKELETAL:  [ ]  joint pain   [ ]  joint swelling GASTROINTESTINAL: [x ]  blood in stool  [ ]   hematemesis GENITOURINARY:  [ ]   dysuria  [ ]   hematuria PSYCHIATRIC:  [x ] history of major depression INTEGUMENTARY:  [ ]  rashes  [ ]  ulcers CONSTITUTIONAL:  [ ]  fever   [ ]  chills   PHYSICAL EXAMINATION:   General: The patient is a well-nourished male, in no acute distress. Vital signs are BP 127/91  Pulse 89  Resp 18  Ht 5\' 10"  (1.778 m)  Wt 244 lb 8 oz (110.904 kg)  BMI 35.08 kg/m2 Pulmonary: There is a good air exchange bilaterally without wheezing or rales. Abdomen: Soft and non-tender with normal pitch bowel sounds. Musculoskeletal: There are no major deformities.  There is no significant extremity pain. Neurologic: No focal weakness or paresthesias are detected, Skin: There are no ulcer or rashes noted. Psychiatric: The patient has normal affect. Cardiovascular: There is a regular rate and rhythm without significant murmur appreciated. Pulse status 2+ radial and 2+ dorsalis pedis pulses bilaterally Lower surety show no evidence of changes of chronic venous hypertension       Impression and Plan:   Had a long discussion with the patient and his wife. Also I  discussed this with his medical case manager following my discussion with the patient and his wife. I think there is no absolute indication for vena cava filter placement. This is similar to Dr. Sharlene Dory opinion that there is no absolute indication for lifelong Coumadin. Certainly is quite concerning with bilateral DVT and pulmonary embolus following hip surgery and subsequent DVT within several weeks of discontinuing his Coumadin. I feel he is at very high risk for DVT and potential pulmonary embolus around the time of any a prolonged immobility and prolonged surgery such as back surgery. I wouldn't recommend vena cava filter placement. I discussed the procedure including potential complications of vena caval occlusion and penetration of the cava with struts of the vena cava filter over time. I filled the benefit of a vena cava filter placement out way his risk. Regarding the potential for removable filter versus lifelong filter, I would recommend permanent filter placement due to his ongoing risk for DVT and pulmonary embolus. He reports that he is to have a probable replacement of his right hip and 2-3 year time frame. We will coordinate this with Dr. Shon Baton. I feel that it would be acceptable to place the filter at the same time as his back surgery if Dr. Shon Baton cells this is exceptable       EARLY, TODD [facility] [place] Office: [phone]  Addendum:  The patient has been re-examined and re-evaluated.  The patient's history and physical has been reviewed and is unchanged.    Christie Copley. is a 47 y.o. male is being admitted with SLIP/DDD L5-S1 WITH LEFT LEG PAIN. All the risks, benefits and other treatment options have been discussed with the patient. The patient has consented to proceed with Procedure(s): POSTERIOR LUMBAR FUSION 1 LEVEL INSERTION VENA-CAVA FILTER as a surgical intervention.  EARLY, TODD 01/10/2013 8:21 AM [facility]

## 2013-01-10 NOTE — H&P (Signed)
No change to clinical exam Left leg pain is greatest Plan on IVC filter then TLIF L5-S1 H+P reviewed

## 2013-01-10 NOTE — Anesthesia Preprocedure Evaluation (Signed)
Anesthesia Evaluation  Patient identified by MRN, date of birth, ID band Patient awake    Reviewed: Allergy & Precautions, H&P , NPO status , Patient's Chart, lab work & pertinent test results, reviewed documented beta blocker date and time   Airway Mallampati: II TM Distance: >3 FB Neck ROM: full    Dental   Pulmonary neg pulmonary ROS,  breath sounds clear to auscultation        Cardiovascular hypertension, On Medications + Peripheral Vascular Disease negative cardio ROS  Rhythm:regular     Neuro/Psych PSYCHIATRIC DISORDERS negative neurological ROS     GI/Hepatic Neg liver ROS, hiatal hernia, GERD-  Medicated and Controlled,  Endo/Other  negative endocrine ROS  Renal/GU negative Renal ROS  negative genitourinary   Musculoskeletal   Abdominal   Peds  Hematology negative hematology ROS (+)   Anesthesia Other Findings See surgeon's H&P   Reproductive/Obstetrics negative OB ROS                           Anesthesia Physical Anesthesia Plan  ASA: III  Anesthesia Plan: General   Post-op Pain Management:    Induction: Intravenous  Airway Management Planned: Oral ETT  Additional Equipment:   Intra-op Plan:   Post-operative Plan:   Informed Consent: I have reviewed the patients History and Physical, chart, labs and discussed the procedure including the risks, benefits and alternatives for the proposed anesthesia with the patient or authorized representative who has indicated his/her understanding and acceptance.   Dental Advisory Given  Plan Discussed with: CRNA and Surgeon  Anesthesia Plan Comments:         Anesthesia Quick Evaluation

## 2013-01-10 NOTE — Progress Notes (Signed)
Pt and wife state that Morphine does not work for him.  Was told to let us know.

## 2013-01-10 NOTE — Op Note (Signed)
OPERATIVE REPORT  DATE OF SURGERY: 01/10/2013  PATIENT: Kent Aly., 47 y.o. male MRN: [id number]  DOB: June 20, 1966  PRE-OPERATIVE DIAGNOSIS: History of DVT  POST-OPERATIVE DIAGNOSIS:  Same  PROCEDURE: Inferior vena cava filter placement  SURGEON:  Gretta Began, M.D.    ANESTHESIA:  Gen.  EBL: Minimal ml  Total I/O In: 1000 [I.V.:1000] Out: -   BLOOD ADMINISTERED: None  DRAINS: None  SPECIMEN: None  COUNTS CORRECT:  YES  PLAN OF CARE: Lumbar surgery with Dr. Shon Baton   PATIENT DISPOSITION:  PACU - hemodynamically stable  PROCEDURE DETAILS: The patient was taken to the upper and placed supine position where the area both groins are prepped and draped in the sterile fashion. Using SonoSite ultrasound and the Seldinger technique the right common femoral vein was accessed with the 18-gauge needle. Guidewire was passed up the level of the L2 area. The Cordis delivery sheath was passed over the guidewire and positioned around the L2 level. Hand injections were used to confirm that the cava size was not too large for the filter appeared there was slight reflux in the left renal vein in this area was marked. Dilator sheath was removed and the vena cava filter was passed through the sheath. This was position at the appropriate level and was deployed with releasing it from the constraining device. Good wall apposition and no evidence of tilting. The deployment sheath was removed and pressure was held for 5 minutes on the femoral vein. The patient then was placed in position for lumbar surgery with Dr. Serina Cowper, M.D. 01/10/2013 9:59 AM

## 2013-01-10 NOTE — Progress Notes (Signed)
Patient stated that per is his primary doctor, he should not be taking acetaminophen due to his liver issues. PA Shuford on call was notified. Orders given to discontinue medication.

## 2013-01-10 NOTE — Brief Op Note (Signed)
01/10/2013  2:04 PM  PATIENT:  Laurence Aly.  47 y.o. male  PRE-OPERATIVE DIAGNOSIS:  SLIP/DDD L5-S1 WITH LEFT LEG PAIN, History of deep Vein thrombosis and pulmonary embolism  POST-OPERATIVE DIAGNOSIS:  SLIP/DDD L5-S1 WITH LEFT LEG PAIN, History of deep Vein thrombosis and pulmonary embolism  PROCEDURE:  Procedure(s) with comments: POSTERIOR LUMBAR FUSION 1 LEVEL (N/A) - Transforaminal lumbar interbody fusion (TLIF) Lumbar 5-Sacrum 1 INSERTION VENA-CAVA FILTER (N/A) - Ultrasound guided  SURGEON:  Surgeon(s) and Role: Panel 1:    * Venita Lick, MD - Primary  Panel 2:    * Larina Earthly, MD - Primary  PHYSICIAN ASSISTANT:   ASSISTANTS: none   ANESTHESIA:   general  EBL:  Total I/O In: 3700 [I.V.:3700] Out: 650 [Urine:450; Blood:200]  BLOOD ADMINISTERED:none  DRAINS: none   LOCAL MEDICATIONS USED:  MARCAINE     SPECIMEN:  No Specimen  DISPOSITION OF SPECIMEN:  N/A  COUNTS:  YES  TOURNIQUET:  * No tourniquets in log *  DICTATION: .Other Dictation: Dictation Number [id number]  PLAN OF CARE: Admit to inpatient   PATIENT DISPOSITION:  PACU - hemodynamically stable.

## 2013-01-10 NOTE — Anesthesia Procedure Notes (Signed)
Procedure Name: Intubation Date/Time: 01/10/2013 8:51 AM Performed by: Coralee Rud Pre-anesthesia Checklist: Patient identified Patient Re-evaluated:Patient Re-evaluated prior to inductionOxygen Delivery Method: Circle system utilized Preoxygenation: Pre-oxygenation with 100% oxygen Intubation Type: IV induction Ventilation: Mask ventilation without difficulty Laryngoscope Size: Miller and 3 Grade View: Grade I Tube type: Oral Tube size: 8.0 mm Airway Equipment and Method: Stylet and LTA kit utilized Placement Confirmation: ETT inserted through vocal cords under direct vision,  breath sounds checked- equal and bilateral and positive ETCO2 Secured at: 23 cm Tube secured with: Tape Dental Injury: Teeth and Oropharynx as per pre-operative assessment

## 2013-01-10 NOTE — Transfer of Care (Signed)
Immediate Anesthesia Transfer of Care Note  Patient: Kent Jones.  Procedure(s) Performed: Procedure(s) with comments: POSTERIOR LUMBAR FUSION 1 LEVEL (N/A) - Transforaminal lumbar interbody fusion (TLIF) Lumbar 5-Sacrum 1 INSERTION VENA-CAVA FILTER (N/A) - Ultrasound guided  Patient Location: PACU  Anesthesia Type:General  Level of Consciousness: awake, sedated and patient cooperative  Airway & Oxygen Therapy: Patient Spontanous Breathing and Patient connected to face mask oxygen  Post-op Assessment: Report given to PACU RN, Post -op Vital signs reviewed and stable, Patient moving all extremities and Patient moving all extremities X 4  Post vital signs: Reviewed and stable  Complications: No apparent anesthesia complications

## 2013-01-10 NOTE — Progress Notes (Addendum)
ANTICOAGULATION CONSULT NOTE - Initial Consult  Pharmacy Consult for Lovenox and Coumadin Indication: hx of PE/DVT  Allergies  Allergen Reactions  . Penicillins Hives    Patient Measurements: Height: 5\' 10"  (177.8 cm) Weight: 245 lb (111.131 kg) IBW/kg (Calculated) : 73   Vital Signs: Temp: 98 F (36.7 C) (03/12 1620) BP: 100/63 mmHg (03/12 1620) Pulse Rate: 94 (03/12 1620)  Labs:  Recent Labs  01/10/13 0652 01/10/13 1748  HGB  --  12.3*  HCT  --  35.3*  PLT  --  198  APTT 30  --   LABPROT 15.3*  --   INR 1.23  --   CREATININE  --  0.81    Estimated Creatinine Clearance: 143.7 ml/min (by C-G formula based on Cr of 0.81).   Medical History: Past Medical History  Diagnosis Date  . DVT (deep venous thrombosis)   . Depression   . Hyperlipidemia   . History of kidney stones   . Liver disease     history of temporary elevation of LFTs; no known history of Hepatitis 01/03/13  . Personal history of PE (pulmonary embolism)   . Hypertension     no med at this time  . Peripheral vascular disease     dvt    . GERD (gastroesophageal reflux disease)   . H/O hiatal hernia   . Stones in the urinary tract   . Arthritis     Medications:  Scheduled:  . [COMPLETED] acetaminophen  1,000 mg Intravenous Once  . acetaminophen  1,000 mg Intravenous Q6H  . atorvastatin  10 mg Oral q1800  . [COMPLETED] dexamethasone  4 mg Intravenous Once  . dexamethasone  4 mg Intravenous Q6H   Or  . dexamethasone  4 mg Oral Q6H  . [START ON 01/11/2013] enoxaparin (LOVENOX) injection  30 mg Subcutaneous Q24H  . HYDROmorphone      . HYDROmorphone      . HYDROmorphone PCA 0.3 mg/mL   Intravenous Q4H  . methocarbamol      . morphine      . oxyCODONE      . pantoprazole  40 mg Oral Daily  . sodium chloride  3 mL Intravenous Q12H  . [COMPLETED] vancomycin  1,500 mg Intravenous On Call to OR  . venlafaxine  75 mg Oral BID  . [START ON 01/11/2013] warfarin  5 mg Oral q1800  . Warfarin -  Physician Dosing Inpatient   Does not apply q1800  . [DISCONTINUED] morphine   Intravenous Q4H    Assessment: 47 yr old male s/p lumbar fusion and IVC filter on coumadin and lovenox for hx of DVT/PE.  Anticoagulation to start tomorrow per MD.  Goal of Therapy:  INR 2-3 Monitor platelets by anticoagulation protocol: Yes   Plan:  Start Lovenox 110mg  SQ bid-start tomorrow-3/13. Coumadin 7.5mg  po x 1 dose on 3/13.  Daily PT/INR.  Wendie Simmer, PharmD, BCPS Clinical Pharmacist  Pager: [phone]

## 2013-01-10 NOTE — Preoperative (Signed)
Beta Blockers   Reason not to administer Beta Blockers:Not Applicable 

## 2013-01-10 NOTE — OR Nursing (Signed)
IVC filter placement start at 0905; end at [phone].

## 2013-01-11 LAB — PROTIME-INR: Prothrombin Time: 15.7 seconds — ABNORMAL HIGH (ref 11.6–15.2)

## 2013-01-11 MED ORDER — WARFARIN SODIUM 5 MG PO TABS
5.0000 mg | ORAL_TABLET | Freq: Once | ORAL | Status: AC
  Administered 2013-01-11: 5 mg via ORAL
  Filled 2013-01-11: qty 1

## 2013-01-11 MED ORDER — OXYCODONE HCL 5 MG PO TABS
20.0000 mg | ORAL_TABLET | ORAL | Status: DC | PRN
  Administered 2013-01-11 – 2013-01-13 (×11): 20 mg via ORAL
  Filled 2013-01-11 (×11): qty 4

## 2013-01-11 MED ORDER — ENOXAPARIN SODIUM 30 MG/0.3ML ~~LOC~~ SOLN
30.0000 mg | Freq: Two times a day (BID) | SUBCUTANEOUS | Status: DC
  Administered 2013-01-11 – 2013-01-13 (×5): 30 mg via SUBCUTANEOUS
  Filled 2013-01-11 (×6): qty 0.3

## 2013-01-11 NOTE — Evaluation (Signed)
Occupational Therapy Evaluation Patient Details Name: Kent Jones. MRN: [id number] DOB: June 04, 1966 Today's Date: 01/11/2013 Time: 4098-1191 OT Time Calculation (min): 24 min  OT Assessment / Plan / Recommendation Clinical Impression  Pt demos decline in function with ADLs, strength, activity tolerance following back surgery. Pt would benefit from OT services to address these impairments to hep restore PLOF to return home safely    OT Assessment  Patient needs continued OT Services    Follow Up Recommendations  Home health OT;Supervision/Assistance - 24 hour;Supervision - Intermittent    Barriers to Discharge None Wife has to return to work on Monday, but son will be home from college on spring break for 1 week and will be able to assist  Equipment Recommendations  3 in 1 bedside comode;Tub/shower bench;Other (comment) (ADL A/E kit)    Recommendations for Other Services    Frequency  Min 2X/week    Precautions / Restrictions Precautions Precautions: Back;Fall Precaution Booklet Issued: Yes (comment) Precaution Comments: Pt able to recall 3/3 back precautions Required Braces or Orthoses: Spinal Brace Spinal Brace: Lumbar corset Restrictions Weight Bearing Restrictions: No   Pertinent Vitals/Pain     ADL  Grooming: Wash/dry hands;Wash/dry face;Supervision/safety;Set up Upper Body Bathing: Simulated;Supervision/safety;Set up Lower Body Bathing: Maximal assistance Upper Body Dressing: Performed;Supervision/safety;Set up Lower Body Dressing: Performed;Maximal assistance Toilet Transfer: Other (comment) (Pt declined getting OOB to ambulate to bathroom) Transfers/Ambulation Related to ADLs: Pt declined sitting EOB and OOB acitiity for ambulating to bathroom, pt agreeable to ADLs at bed level and A/E, DME education. Pt's wife tried to encourage pt for OOB activity ADL Comments: Pt and his provided with education and demo of ADL A/E, educated on DME for bathroom at home    OT  Diagnosis: Generalized weakness;Acute pain  OT Problem List: Decreased strength;Decreased knowledge of use of DME or AE;Decreased activity tolerance;Pain OT Treatment Interventions: Self-care/ADL training;Balance training;Therapeutic exercise;Neuromuscular education;Therapeutic activities;DME and/or AE instruction;Patient/family education   OT Goals Acute Rehab OT Goals OT Goal Formulation: With patient/family Time For Goal Achievement: 01/18/13 Potential to Achieve Goals: Good ADL Goals Pt Will Perform Grooming: with set-up;with supervision;Standing at sink ADL Goal: Grooming - Progress: Goal set today Pt Will Perform Lower Body Bathing: with mod assist;with adaptive equipment ADL Goal: Lower Body Bathing - Progress: Goal set today Pt Will Perform Lower Body Dressing: with mod assist;with adaptive equipment ADL Goal: Lower Body Dressing - Progress: Goal set today Pt Will Transfer to Toilet: with supervision;with min assist;with DME;Grab bars ADL Goal: Toilet Transfer - Progress: Goal set today Pt Will Perform Tub/Shower Transfer: with supervision;with min assist;with DME;Grab bars ADL Goal: Tub/Shower Transfer - Progress: Goal set today  Visit Information  Last OT Received On: 01/11/13 Assistance Needed: +1    Subjective Data  Subjective: " I hope to go home Saturday " Patient Stated Goal: To return home   Prior Functioning     Home Living Lives With: Spouse Available Help at Discharge: Available PRN/intermittently;Family Type of Home: House Home Access: Stairs to enter Secretary/administrator of Steps: 2 Entrance Stairs-Rails: Right Home Layout: One level Bathroom Shower/Tub: Engineer, manufacturing systems: Standard Home Adaptive Equipment: Straight cane Prior Function Level of Independence: Independent Able to Take Stairs?: Yes Driving: Yes Communication Communication: No difficulties Dominant Hand: Right         Vision/Perception Vision - History Patient  Visual Report: No change from baseline Perception Perception: Within Functional Limits   Cognition  Cognition Overall Cognitive Status: Appears within functional limits for tasks assessed/performed  Arousal/Alertness: Awake/alert Orientation Level: Appears intact for tasks assessed Behavior During Session: [facility] for tasks performed    Extremity/Trunk Assessment Right Upper Extremity Assessment RUE ROM/Strength/Tone: [facility] for tasks assessed Left Upper Extremity Assessment LUE ROM/Strength/Tone: WFL for tasks assessed Right Lower Extremity Assessment RLE ROM/Strength/Tone: Within functional levels RLE Sensation: WFL - Light Touch Left Lower Extremity Assessment LLE ROM/Strength/Tone: Within functional levels LLE Sensation: WFL - Light Touch Trunk Assessment Trunk Assessment: Normal     Mobility Bed Mobility Bed Mobility: Not assessed Details for Bed Mobility Assistance: Pt declined sitting EOB Transfers Transfers: Not assessed        Balance Balance Balance Assessed: No   End of Session OT - End of Session Patient left: in bed;with call bell/phone within reach;with family/visitor present  GO     Galen Manila 01/11/2013, 3:10 PM

## 2013-01-11 NOTE — Plan of Care (Signed)
Problem: Phase III Progression Outcomes Goal: Discharge plan remains appropriate-arrangements made Recommend 3 in 1 , tub bench, ADL A/E kit and HH OT for ADL trg and ADL mobility safety trg after acute care d/c

## 2013-01-11 NOTE — Anesthesia Postprocedure Evaluation (Signed)
  Anesthesia Post-op Note  Patient: Kent Jones.  Procedure(s) Performed: Procedure(s) with comments: POSTERIOR LUMBAR FUSION 1 LEVEL (N/A) - Transforaminal lumbar interbody fusion (TLIF) Lumbar 5-Sacrum 1 INSERTION VENA-CAVA FILTER (N/A) - Ultrasound guided  Patient Location: Nursing Unit  Anesthesia Type:General  Level of Consciousness: awake, alert , oriented and patient cooperative  Airway and Oxygen Therapy: Patient Spontanous Breathing  Post-op Pain: mild  Post-op Assessment: Post-op Vital signs reviewed, Patient's Cardiovascular Status Stable, Respiratory Function Stable, Patent Airway, No signs of Nausea or vomiting, Adequate PO intake, Pain level controlled, Pain level not controlled, No headache, No backache, No residual numbness and No residual motor weakness  Post-op Vital Signs: Reviewed and stable  Complications: No apparent anesthesia complications

## 2013-01-11 NOTE — Op Note (Signed)
NAMECELESTE, Kent Jones NO.:  [id number]  MEDICAL RECORD NO.:  [id number]  LOCATION:  5N20C                        FACILITY:  [facility]  PHYSICIAN:  Kent Beal, MD    DATE OF BIRTH:  1966/07/21  DATE OF PROCEDURE:  01/10/2013 DATE OF DISCHARGE:                              OPERATIVE REPORT   PREOPERATIVE DIAGNOSIS:  Lumbar spondylolisthesis L5-S1 with radicular left leg pain.  POSTOPERATIVE DIAGNOSIS:  Lumbar spondylolisthesis L5-S1 with radicular left leg pain.  OPERATIVE PROCEDURES: 1. Implantation of intravenous IVC filter by Dr. Arbie Jones, Vascular     Surgery. 2. Gill decompression, left side L5-S1. 3. Implantation of biomechanical intervertebral device, which was the     Titan size 9 large, banana shaped cage packed with local bone     autograft plus DBX mix. 4. Segmental instrumentation, L5-S1 with Synthes matrix MIS pedicle     screws, and posterior lateral arthrodesis, L5-S1 with the remainder     local bone and DBX mix.  COMPLICATIONS:  None.  Intraoperative EMG and neuro monitoring was performed throughout the lumbar spinal procedure and there was no abnormalities noted.  All screws tested within the green range and indicative of no penetration.  HISTORY:  He presents today for management of his severe low back pain. The patient had longstanding back pain ever since a work related injury, which resulted in acetabular fracture and progressive debilitating pain. 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  The patient had failed over 3 years of conservative nonoperative back pain management continued to have severe debilitating back, buttock, and left leg pain.  After failing all of conservative management, we elected to proceed with surgery.  Because of his history of DVT, he was evaluated by Dr. Arbie Jones and found to be good candidate for an IVC filter placement before the spinal fusion procedure.  The patient therefore  underwent.  OPERATIVE NOTE:  The patient was brought to the operating room, placed supine on the operating table.  After successful induction of general anesthesia and endotracheal intubation, the right groin was prepped and draped by Dr. Arbie Jones and the Vascular team.  Please refer to his dictation for specifics on the insertion of the IVC filter.  Once this was complete, all appropriate intraoperative nerve monitoring needles were placed into the lower extremity, and the patient was repositioned prone onto the Wilson frame.  All bony prominences were well padded. The back was prepped and draped in a standard fashion.  Once this was completed, we did a second time-out confirming the spinal portion procedure which was an L5-S1 TLIF with left-sided leg pain.  The patient had received appropriate antibiotics and all other pertinent positives were reviewed.  Once this was completed on the right side, I made a small incision centered over the L5-S1 space.  Jamshidi needle was advanced percutaneously through that incision down to the lateral aspect of the L4-5 facet complex, centered on the L5 transverse process.  I then confirmed satisfactory position and trajectory, and then advanced the Jamshidi using x-ray and neuro monitoring through the pedicle and into the L5 vertebral body.  I repeated this procedure at S1.  I then  placed guide pins, tapped and then placed appropriate size MIS pedicle screws through L5 and S1.  I then stimulated both of these screws directly and there was no evidence of breach.  I then went through the left-hand side, made a slightly larger incision and dissected down to the deep fascia.  I incised the deep fascia and then mobilized the paraspinal muscle in order to expose the posterior lateral aspect of the spine.  I then identified the appropriate starting position for the L5 and S1 pedicles screws and using the same technique that I had used on the contralateral  side.  I placed pedicle screws at L5 and S1.  At this point, I then placed a self-retaining retractor, so that I could adequately visualize the L5-S1 space.  I then removed the L5 lamina and inferior L5 facet in its entirety.  I used an osteotome to resect the inferior L5 facet and complete facetectomy, and I used a 2 and 3 mm Kerrison to perform a complete left-sided laminectomy of L5.  I then removed the ligamentum flavum using Kerrison rongeurs and exposed the underlying thecal sac.  I identified the S1 nerve root and protected it with a Neuro Patty and nerve hook.  This allowed me to visualize the medial aspect of the S1 pedicle.  I then removed the overhanging osteophyte from the superior facet of S1 to completely expose the pedicle.  I then removed the remaining L5 pars.  I could now palpate the medial and inferior aspect of the S1 pedicle, and there was no evidence of breach of the pedicle by the pedicle screw.  I then identified the L5 nerve root and was the L5 foramen.  Once I was able to identify this, I could then protect that nerve and palpate the inferior aspect of the L5 pedicle.  I then removed the remaining osteophyte to completely expose the inferior border of the L5 pedicle.  I then traced along the L5 nerve root using my 2 mm Kerrison, resecting any osteophyte that was still causing pressure on the nerve.  At this point, I had a complete posterior lateral decompression complete foraminotomy and facetectomy exposing the L5 nerve root, the lateral aspect of the thecal sac and the S1 nerve root.  I then used bipolar electrocautery to coagulate the venous plexus nerves, epidural veins, and then I exposed the L5-S1 disk. I incised the disk with a 15 blade scalpel and then using a combination of spreaders, curettes, Kerrison rongeurs.  I removed all of the disk material from the L5 and S1 space.  I then used side scraping raspers to ensure that I had removed the subchondral  bone from the endplates of L5 and S1.  I then trialed and then elected to use the 9 Titan titanium intervertebral cage.  It was packed with the local bone that I harvested from the decompression.  I then placed some bone graft along the anterior anulus, and packed it down and then placed the cage.  The cage went in vertical and then I was able to advance it and reposition until it was horizontal.  I then checked it and placed the L5 and S1 to ensure that the cage was below the posterior aspect of the vertebral body endplates.  Once I knew it was properly seated, I irrigated the wound copiously with normal saline.  I then attached the heads to the L5 and S1 pedicle screws and made sure they snapped on according to the manufacture's standards.  I then used a 45-mm rod and locked it and torqued it down appropriately.  I then irrigated this size copiously with normal saline and then checked again the nerve roots and the cage and the thecal sac.  I used a Public house manager to ensure I had adequate decompression superiorly and that both nerve roots were adequately decompressed.  I then placed thrombin Gelfoam over the exposed thecal sac, closed the deep fascia with interrupted #1 Vicryl suture, then a running 0 Vicryl suture, then interrupted 2-0 Vicryl sutures and a 3-0 Monocryl.  I then went back to the right-hand side, and then I used a small curette and Cobb elevator to decorticate the facet joint at L5-S1. I then placed bone graft along the facet and into the posterior lateral corner.  I then measured and placed the same size rod and torqued it down appropriately.  Final x-rays were satisfactory.  I made sure all the nuts were torqued down according to manufacturer's standards.  I then closed this right-sided wound in the same fashion as I closed the left.  Steri-Strips and dry dressing were applied.  The patient was extubated, transferred to PACU without incident.  At the end of the case,  all needle and sponge counts were correct.  There was no adverse intraoperative events.     Kent Beal, MD     DDB/MEDQ  D:  01/10/2013  T:  01/11/2013  Job:  [id number]

## 2013-01-11 NOTE — Progress Notes (Signed)
UR COMPLETED  

## 2013-01-11 NOTE — Progress Notes (Signed)
    Subjective: Procedure(s) (LRB): POSTERIOR LUMBAR FUSION 1 LEVEL (N/A) INSERTION VENA-CAVA FILTER (N/A) 1 Day Post-Op  Patient reports pain as 6 on 0-10 scale.  Reports decreased leg pain reports incisional back pain   Negative void Negative bowel movement Positive flatus Negative chest pain or shortness of breath  Objective: Vital signs in last 24 hours: Temp:  [97.6 F (36.4 C)-98.6 F (37 C)] 98.6 F (37 C) (03/13 0555) Pulse Rate:  [89-106] 89 (03/13 0555) Resp:  [12-27] 20 (03/13 0555) BP: (100-116)/(36-73) 116/68 mmHg (03/13 0555) SpO2:  [92 %-98 %] 95 % (03/13 0555) Weight:  [111.131 kg (245 lb)] 111.131 kg (245 lb) (03/12 1651)  Intake/Output from previous day: 03/12 0701 - 03/13 0700 In: 4720 [I.V.:4720] Out: 5325 [Urine:5125; Blood:200]  Labs:  Recent Labs  01/10/13 1748  WBC 10.5  RBC 4.28  HCT 35.3*  PLT 198    Recent Labs  01/10/13 1748  CREATININE 0.81    Recent Labs  01/10/13 0652 01/11/13 0630  INR 1.23 1.28    Physical Exam: Neurologically intact Neurovascular intact Intact pulses distally Dorsiflexion/Plantar flexion intact Incision: dressing C/D/I and no drainage Compartment soft NVI   Assessment/Plan: Patient stable  xrays pending Continue mobilization with physical therapy Continue care  Advance diet Up with therapy Mobilization Lovenox and coumadin to start  If unable to void in timely fashion will need rectal exam to r/o cauda equina syndrome  Venita Lick, MD [facility] [phone]

## 2013-01-11 NOTE — Progress Notes (Signed)
Orthopedic Tech Progress Note Patient Details:  Kent Jones Feb 13, 1966 [id number]  Patient ID: Kent Aly., male   DOB: Feb 27, 1966, 47 y.o.   MRN: [id number]   Kent Jones 01/11/2013, 10:56 AM LUMBAR CORSET COMPLETED BY [employer].

## 2013-01-11 NOTE — Evaluation (Signed)
Physical Therapy Evaluation Patient Details Name: Kent Jones. MRN: [id number] DOB: Apr 11, 1966 Today's Date: 01/11/2013 Time: 2595-6387 PT Time Calculation (min): 20 min  PT Assessment / Plan / Recommendation Clinical Impression  Pt is a 47 y.o male s/p lumbar fusion. Pt highly motivated to return home. Pt presents with decreased independence with mobility secondary to pain and back preacutions. Pt will have help from wife upon D/C but will not be 24hr. Recommend HH and HHPT upon acute D/C for continued therapy. Anticipate pt will progress well towards acute PT goals.    PT Assessment  Patient needs continued PT services    Follow Up Recommendations  Home health PT    Does the patient have the potential to tolerate intense rehabilitation      Barriers to Discharge        Equipment Recommendations  Rolling walker with 5" wheels;Cane (cane vs RW pending rehab progress; 3in1 commode, shower benc)    Recommendations for Other Services OT consult   Frequency Min 5X/week    Precautions / Restrictions Precautions Precautions: Back;Fall Precaution Booklet Issued: Yes (comment) Required Braces or Orthoses: Spinal Brace Spinal Brace: Lumbar corset Restrictions Weight Bearing Restrictions: No   Pertinent Vitals/Pain 4/10 premedicated.      Mobility  Bed Mobility Bed Mobility: Sit to Sidelying Right;Sit to Supine Supine to Sit: Not tested (comment) Sit to Supine: 4: Min guard;With rail Sit to Sidelying Right: 4: Min guard;With rail Details for Bed Mobility Assistance: required cues for sequencing and to adhere to back precautions; educated on log roll technique; demo ability to log roll sidelying to supine after cues Transfers Transfers: Sit to Stand;Stand to Sit Sit to Stand: 5: Supervision;With armrests;From chair/3-in-1 Stand to Sit: 5: Supervision;To elevated surface;To bed Details for Transfer Assistance: required bed to be elevated for stand to sit; cues for hand  placement and safety Ambulation/Gait Ambulation/Gait Assistance: 5: Supervision Ambulation Distance (Feet): 50 Feet Assistive device: Rolling walker Ambulation/Gait Assistance Details: cues for safety with RW and sequencing. Gait Pattern: Decreased stride length;Wide base of support Gait velocity: decreased General Gait Details: c/o of pain with ambulating Stairs: No Wheelchair Mobility Wheelchair Mobility: No    Exercises Total Joint Exercises Ankle Circles/Pumps: AROM;Both;10 reps;Supine   PT Diagnosis: Difficulty walking;Acute pain  PT Problem List: Decreased range of motion;Decreased balance;Decreased mobility;Decreased knowledge of use of DME;Decreased safety awareness;Decreased knowledge of precautions;Pain PT Treatment Interventions: DME instruction;Gait training;Stair training;Functional mobility training;Therapeutic activities;Therapeutic exercise;Balance training;Neuromuscular re-education;Patient/family education   PT Goals Acute Rehab PT Goals PT Goal Formulation: With patient Time For Goal Achievement: 01/18/13 Potential to Achieve Goals: Good Pt will go Supine/Side to Sit: with modified independence PT Goal: Supine/Side to Sit - Progress: Goal set today Pt will go Sit to Supine/Side: with modified independence PT Goal: Sit to Supine/Side - Progress: Goal set today Pt will go Sit to Stand: with modified independence PT Goal: Sit to Stand - Progress: Goal set today Pt will go Stand to Sit: with modified independence PT Goal: Stand to Sit - Progress: Goal set today Pt will Transfer Bed to Chair/Chair to Bed: with modified independence PT Transfer Goal: Bed to Chair/Chair to Bed - Progress: Goal set today Pt will Ambulate: 51 - 150 feet;with least restrictive assistive device;with supervision PT Goal: Ambulate - Progress: Goal set today Pt will Go Up / Down Stairs: 1-2 stairs;with supervision;with rail(s) PT Goal: Up/Down Stairs - Progress: Goal set today Additional  Goals Additional Goal #1: Pt will be able to verbalize and demo  awareness of back precautions.  PT Goal: Additional Goal #1 - Progress: Goal set today  Visit Information  Last PT Received On: 01/11/13 Assistance Needed: +1    Subjective Data  Subjective: I am ready to get back to bed.  Patient Stated Goal: return home   Prior Functioning  Home Living Lives With: Spouse Available Help at Discharge: Available PRN/intermittently;Family (wife works during the day) Type of Home: House Home Access: Stairs to enter Secretary/administrator of Steps: 2 Entrance Stairs-Rails: Right Home Layout: One level Bathroom Shower/Tub: Engineer, manufacturing systems: Standard Prior Function Level of Independence: Independent Able to Take Stairs?: Yes Driving: Yes Communication Communication: No difficulties    Cognition  Cognition Overall Cognitive Status: Appears within functional limits for tasks assessed/performed Arousal/Alertness: Awake/alert Orientation Level: Appears intact for tasks assessed Behavior During Session: [facility] for tasks performed    Extremity/Trunk Assessment Right Lower Extremity Assessment RLE ROM/Strength/Tone: Within functional levels RLE Sensation: WFL - Light Touch Left Lower Extremity Assessment LLE ROM/Strength/Tone: Within functional levels LLE Sensation: WFL - Light Touch Trunk Assessment Trunk Assessment: Normal   Balance Balance Balance Assessed: No  End of Session PT - End of Session Equipment Utilized During Treatment: Gait belt;Back brace Activity Tolerance: Patient tolerated treatment well Patient left: in bed;with call bell/phone within reach;with family/visitor present Nurse Communication: Mobility status  GP     Donell Sievert,  [phone] 01/11/2013, 11:29 AM

## 2013-01-12 ENCOUNTER — Inpatient Hospital Stay (HOSPITAL_COMMUNITY): Payer: Worker's Compensation

## 2013-01-12 MED ORDER — DOCUSATE SODIUM 100 MG PO CAPS: 100.0000 mg | ORAL_CAPSULE | Freq: Three times a day (TID) | ORAL | Status: AC | PRN

## 2013-01-12 MED ORDER — METHOCARBAMOL 500 MG PO TABS: 500.0000 mg | ORAL_TABLET | Freq: Three times a day (TID) | ORAL | Status: AC | PRN

## 2013-01-12 MED ORDER — WARFARIN - PHARMACIST DOSING INPATIENT: Freq: Every day | Status: DC

## 2013-01-12 MED ORDER — ONDANSETRON HCL 4 MG PO TABS: 4.0000 mg | ORAL_TABLET | Freq: Three times a day (TID) | ORAL | Status: AC | PRN

## 2013-01-12 MED ORDER — WARFARIN SODIUM 5 MG PO TABS
5.0000 mg | ORAL_TABLET | Freq: Once | ORAL | Status: AC
  Administered 2013-01-12: 5 mg via ORAL
  Filled 2013-01-12: qty 1

## 2013-01-12 MED ORDER — OXYCODONE HCL 20 MG PO TABS: 20.0000 mg | ORAL_TABLET | ORAL | Status: AC | PRN

## 2013-01-12 MED ORDER — POLYETHYLENE GLYCOL 3350 17 GM/SCOOP PO POWD: 17.0000 g | Freq: Every day | ORAL | Status: AC

## 2013-01-12 NOTE — Progress Notes (Signed)
    Subjective: Procedure(s) (LRB): POSTERIOR LUMBAR FUSION 1 LEVEL (N/A) INSERTION VENA-CAVA FILTER (N/A) 2 Days Post-Op  Patient reports pain as 4 on 0-10 scale.  Reports decreased leg pain reports incisional back pain   Positive void Negative bowel movement Positive flatus Negative chest pain or shortness of breath  Objective: Vital signs in last 24 hours: Temp:  [97.8 F (36.6 C)-98.2 F (36.8 C)] 97.8 F (36.6 C) (03/14 0536) Pulse Rate:  [75-89] 75 (03/14 0536) Resp:  [18] 18 (03/14 0536) BP: (110-111)/(65-71) 111/65 mmHg (03/14 0536) SpO2:  [96 %] 96 % (03/14 0536)  Intake/Output from previous day: 03/13 0701 - 03/14 0700 In: 603 [P.O.:600; I.V.:3] Out: 450 [Urine:450]  Labs:  Recent Labs  01/10/13 1748  WBC 10.5  RBC 4.28  HCT 35.3*  PLT 198    Recent Labs  01/10/13 1748  CREATININE 0.81    Recent Labs  01/11/13 0630 01/12/13 0549  INR 1.28 1.34    Physical Exam: Neurologically intact ABD soft Sensation intact distally Intact pulses distally Incision: dressing C/D/I and no drainage Compartment soft  Assessment/Plan: Patient stable  xrays pending Continue mobilization with physical therapy Continue care  Advance diet Up with therapy Plan for discharge tomorrow Discharge home with home health  Venita Lick, MD [facility] [phone]

## 2013-01-12 NOTE — Progress Notes (Signed)
Physical Therapy Treatment Patient Details Name: Kent Jones. MRN: [id number] DOB: 15-Apr-1966 Today's Date: 01/12/2013 Time: 1308-6578 PT Time Calculation (min): 11 min  PT Assessment / Plan / Recommendation Comments on Treatment Session  Patient progressing well. Will need to review bed mobility next session.     Follow Up Recommendations  Home health PT     Does the patient have the potential to tolerate intense rehabilitation     Barriers to Discharge        Equipment Recommendations  Rolling walker with 5" wheels;Cane    Recommendations for Other Services    Frequency Min 5X/week   Plan Discharge plan remains appropriate;Frequency remains appropriate    Precautions / Restrictions Precautions Precautions: Back;Fall Precaution Comments: Pt able to recall 3/3 back precautions Required Braces or Orthoses: Spinal Brace Spinal Brace: Lumbar corset   Pertinent Vitals/Pain 4/10 back pain. Not time for pain meds at this time    Mobility  Bed Mobility Bed Mobility: Rolling Left;Left Sidelying to Sit Rolling Left: 4: Min guard Left Sidelying to Sit: 4: Min guard;HOB elevated Details for Bed Mobility Assistance: Will need to attempt next session with HOB flat Transfers Sit to Stand: 6: Modified independent (Device/Increase time) Stand to Sit: 6: Modified independent (Device/Increase time) Ambulation/Gait Ambulation/Gait Assistance: 5: Supervision Ambulation Distance (Feet): 300 Feet Assistive device: Rolling walker Ambulation/Gait Assistance Details: Cues for positioning of RW and posture Gait Pattern: Step-through pattern Stairs: Yes Stairs Assistance: 4: Min assist Stairs Assistance Details (indicate cue type and reason): Patient with HHA on L as he does not have rails to enter. Patient did well with no buckling or balance issues Stair Management Technique: Forwards;Step to pattern;No rails Number of Stairs: 2    Exercises     PT Diagnosis:    PT Problem List:    PT Treatment Interventions:     PT Goals Acute Rehab PT Goals PT Goal: Supine/Side to Sit - Progress: Progressing toward goal PT Goal: Sit to Stand - Progress: Met PT Goal: Stand to Sit - Progress: Met PT Transfer Goal: Bed to Chair/Chair to Bed - Progress: Met PT Goal: Ambulate - Progress: Met PT Goal: Up/Down Stairs - Progress: Progressing toward goal Additional Goals PT Goal: Additional Goal #1 - Progress: Met  Visit Information  Last PT Received On: 01/12/13 Assistance Needed: +1    Subjective Data      Cognition  Cognition Overall Cognitive Status: Appears within functional limits for tasks assessed/performed Arousal/Alertness: Awake/alert Orientation Level: Appears intact for tasks assessed Behavior During Session: [facility] for tasks performed    Balance     End of Session PT - End of Session Equipment Utilized During Treatment: Gait belt;Back brace Activity Tolerance: Patient tolerated treatment well Patient left: in chair;with call bell/phone within reach Nurse Communication: Mobility status   GP     Fredrich Birks 01/12/2013, 9:18 AM 01/12/2013 Fredrich Birks PTA [phone] pager [phone] office

## 2013-01-12 NOTE — Progress Notes (Signed)
ANTICOAGULATION CONSULT NOTE - Follow Up Consult  Pharmacy Consult for Coumadin Indication: s.p lumar fusion, hx DVT/PE  Allergies  Allergen Reactions  . Penicillins Hives    Patient Measurements: Height: 5\' 10"  (177.8 cm) Weight: 245 lb (111.131 kg) IBW/kg (Calculated) : 73  Vital Signs: Temp: 97.8 F (36.6 C) (03/14 0536) BP: 111/65 mmHg (03/14 0536) Pulse Rate: 75 (03/14 0536)  Labs:  Recent Labs  01/10/13 0652 01/10/13 1748 01/11/13 0630 01/12/13 0549  HGB  --  12.3*  --   --   HCT  --  35.3*  --   --   PLT  --  198  --   --   APTT 30  --   --   --   LABPROT 15.3*  --  15.7* 16.3*  INR 1.23  --  1.28 1.34  CREATININE  --  0.81  --   --     Estimated Creatinine Clearance: 143.7 ml/min (by C-G formula based on Cr of 0.81).  Assessment: 47 yr old male s/p lumbar fusion and IVC filter on coumadin and lovenox for hx of DVT/PE. INR 1.34. No bleeding noted.   Goal of Therapy:  INR 2-3 Monitor platelets by anticoagulation protocol: Yes   Plan:  Coumadin 5mg  po x 1 dose tonight F/U AM PT/INR  Thank you,  Brett Fairy, PharmD, BCPS 01/12/2013 1:57 PM

## 2013-01-12 NOTE — Progress Notes (Signed)
Occupational Therapy Treatment Patient Details Name: Kent Jones. MRN: [id number] DOB: Aug 18, 1966 Today's Date: 01/12/2013 Time: 4098-1191 OT Time Calculation (min): 25 min  OT Assessment / Plan / Recommendation Comments on Treatment Session      Follow Up Recommendations  Home health OT;Supervision/Assistance - 24 hour;Supervision - Intermittent    Barriers to Discharge       Equipment Recommendations  3 in 1 bedside comode;Tub/shower bench    Recommendations for Other Services    Frequency Min 2X/week   Plan Discharge plan remains appropriate    Precautions / Restrictions Precautions Precautions: Back;Fall Precaution Booklet Issued: Yes (comment) Precaution Comments: able to recall 3/3 precautions Required Braces or Orthoses: Spinal Brace Spinal Brace: Lumbar corset Restrictions Weight Bearing Restrictions: No   Pertinent Vitals/Pain 4/10 per pt.    ADL  Lower Body Bathing: Other (comment) (declines a/e will have assistance at home) Lower Body Dressing: Other (comment) (pt./spouse decline a/e states will have a at home) Toilet Transfer: Buyer, retail Method: Stand pivot Tub/Shower Transfer: Other (comment) (declined practice so demo provided and reviewed) Transfers/Ambulation Related to ADLs: stand-pivot/side stepping to simulate toilet transfer ADL Comments: pt. and wife recall a/e from yesterdays visit but decline need "someone will be around to help him with that"    OT Diagnosis:    OT Problem List:   OT Treatment Interventions:     OT Goals ADL Goals ADL Goal: Toilet Transfer - Progress: Progressing toward goals ADL Goal: Tub/Shower Transfer - Progress: Other (comment) (will need to complete transfer, declined today)  Visit Information  Last OT Received On: 01/12/13 Assistance Needed: +1    Subjective Data  Subjective: "ive been through this before so i know how to do this stuff"   Prior Functioning        Cognition  Cognition Overall Cognitive Status: Appears within functional limits for tasks assessed/performed Arousal/Alertness: Awake/alert Orientation Level: Appears intact for tasks assessed Behavior During Session: [facility] for tasks performed    Mobility  Bed Mobility Bed Mobility: Rolling Left;Left Sidelying to Sit;Sitting - Scoot to Edge of Bed;Sit to Supine;Sit to Sidelying Right Rolling Left: 4: Min guard Left Sidelying to Sit: 4: Min guard;HOB flat Sitting - Scoot to Edge of Bed: 4: Min guard Sit to Supine: Other (comment);4: Min assist (cues to maintain no twisting, a for les into bed) Details for Bed Mobility Assistance: HOB placed flat with no rails to simulate home environment, cues to keep from twisting, min a needed for b les into bed.  pt. con't. to reach for hob and lowered rails to pull on. max encouragement to compete transfer without these adaptations.  pt. states "i know i am not supposed to try and pull myself but i do it anyway". reviewed risks of pulling over head on head board. wife present and also encouraged him not to Transfers Transfers: Sit to Stand;Stand to Sit Sit to Stand: 5: Supervision;From bed;With upper extremity assist Stand to Sit: 6: Modified independent (Device/Increase time) Details for Transfer Assistance: declined amb. to b.room    Exercises      Balance     End of Session OT - End of Session Activity Tolerance: Patient tolerated treatment well Patient left: in bed;with call bell/phone within reach;with family/visitor present  GO     Robet Leu 01/12/2013, 12:22 PM

## 2013-01-13 ENCOUNTER — Encounter (HOSPITAL_COMMUNITY): Payer: Self-pay | Admitting: Orthopedic Surgery

## 2013-01-13 LAB — PROTIME-INR: Prothrombin Time: 17.4 seconds — ABNORMAL HIGH (ref 11.6–15.2)

## 2013-01-13 NOTE — Progress Notes (Signed)
Physical Therapy Treatment Patient Details Name: Kent Jones. MRN: [id number] DOB: [birth year] Today's Date: 01/13/2013 Time: 2130-8657 PT Time Calculation (min): 11 min  PT Assessment / Plan / Recommendation Comments on Treatment Session  D/C plan is for home today.  Pt mobility at appropriate level for d/c.    Follow Up Recommendations  Home health PT     Does the patient have the potential to tolerate intense rehabilitation     Barriers to Discharge        Equipment Recommendations  Rolling walker with 5" wheels    Recommendations for Other Services    Frequency Min 5X/week   Plan Discharge plan remains appropriate    Precautions / Restrictions Precautions Precautions: Back Precaution Comments: Pt able to independently recall 3/3 back precautions. Required Braces or Orthoses: Spinal Brace Spinal Brace: Lumbar corset   Pertinent Vitals/Pain 4/10    Mobility  Bed Mobility Bed Mobility: Rolling Right;Right Sidelying to Sit Rolling Right: 6: Modified independent (Device/Increase time) Rolling Left: 6: Modified independent (Device/Increase time) Right Sidelying to Sit: 6: Modified independent (Device/Increase time) Sit to Sidelying Right: 6: Modified independent (Device/Increase time) Details for Bed Mobility Assistance: Pt demo good technique supine <--> sit using logroll. Transfers Sit to Stand: 6: Modified independent (Device/Increase time);From bed;With upper extremity assist Stand to Sit: 6: Modified independent (Device/Increase time);To bed;With upper extremity assist Ambulation/Gait Ambulation/Gait Assistance: 5: Supervision Ambulation Distance (Feet): 350 Feet Assistive device: Rolling walker Gait Pattern: Within Functional Limits Gait velocity: WFL    Exercises     PT Diagnosis:    PT Problem List:   PT Treatment Interventions:     PT Goals Acute Rehab PT Goals PT Goal: Supine/Side to Sit - Progress: Met PT Goal: Sit to Supine/Side - Progress:  Met PT Goal: Sit to Stand - Progress: Met PT Goal: Stand to Sit - Progress: Met PT Transfer Goal: Bed to Chair/Chair to Bed - Progress: Met PT Goal: Ambulate - Progress: Met PT Goal: Up/Down Stairs - Progress: Progressing toward goal Additional Goals PT Goal: Additional Goal #1 - Progress: Met  Visit Information  Last PT Received On: 01/13/13 Assistance Needed: +1    Subjective Data  Subjective: "I'm doing fine." Patient Stated Goal: home   Cognition  Cognition Overall Cognitive Status: Appears within functional limits for tasks assessed/performed Arousal/Alertness: Awake/alert Orientation Level: Appears intact for tasks assessed Behavior During Session: [facility] for tasks performed    Balance     End of Session PT - End of Session Equipment Utilized During Treatment: Gait belt;Back brace Activity Tolerance: Patient tolerated treatment well Patient left: in chair;with call bell/phone within reach   GP     Ilda Foil 01/13/2013, 10:19 AM  Aida Raider, PT  Office # [phone] Pager [phone]

## 2013-01-13 NOTE — Care Management Note (Signed)
CARE MANAGEMENT NOTE 01/13/2013  Patient:  Kent Jones, Kent Jones   Account Number:  [id number]  Date Initiated:  01/12/2013  Documentation initiated by:  Vance Peper  Subjective/Objective Assessment:   47 yr old male s/p Lf-S1 TLIF     Action/Plan:   Patient has brace.   Anticipated DC Date:  01/12/2013   Anticipated DC Plan:  HOME W HOME HEALTH SERVICES      DC Planning Services  CM consult      PAC Choice  DURABLE MEDICAL EQUIPMENT  HOME HEALTH   Choice offered to / List presented to:     DME arranged  WALKER - Lavone Nian      DME agency  [facility]     HH arranged  HH-2 PT      [facility] agency  [facility]   Status of service:  Completed, signed off Medicare Important Message given?   (If response is "NO", the following Medicare IM given date fields will be blank) Date Medicare IM given:   Date Additional Medicare IM given:    Discharge Disposition:  HOME W HOME HEALTH SERVICES  Per UR Regulation:    If discussed at Long Length of Stay Meetings, dates discussed:    Comments:  01/13/13 10:10 Vance Peper, RN BSN Case Manager [phone] Patient will need home health physical therapy and a rolling walker. CM received a call from [employer] RNCM with [employer]Larita Fife stated that there was a mixup with worker's comp arranging for DME, and authorized CM to have [facility] provide [facility] and Home  Health for patient. CM will email her the orders: [email or web address] phone: [phone]. Faxed orders to [facility].

## 2013-01-13 NOTE — Progress Notes (Signed)
HH set up via CM, Workers Comp. DME sent to home. Patient given Scripts.

## 2013-01-13 NOTE — Progress Notes (Signed)
Subjective: 3 Days Post-Op Procedure(s) (LRB): POSTERIOR LUMBAR FUSION 1 LEVEL (N/A) INSERTION VENA-CAVA FILTER (N/A) Patient reports pain as mild.  No BM yet.  Objective: Vital signs in last 24 hours: Temp:  [97.9 F (36.6 C)-98.6 F (37 C)] 97.9 F (36.6 C) (03/15 0552) Pulse Rate:  [69-89] 69 (03/15 0552) Resp:  [18-20] 18 (03/15 0552) BP: (109-127)/(69-91) 127/91 mmHg (03/15 0552) SpO2:  [94 %-98 %] 98 % (03/15 0552)  Intake/Output from previous day: 03/14 0701 - 03/15 0700 In: 960 [P.O.:960] Out: -  Intake/Output this shift:     Recent Labs  01/10/13 1748  HGB 12.3*    Recent Labs  01/10/13 1748  WBC 10.5  RBC 4.28  HCT 35.3*  PLT 198    Recent Labs  01/10/13 1748  CREATININE 0.81    Recent Labs  01/12/13 0549 01/13/13 0655  INR 1.34 1.47    PE:  wound dressed and dry.  NVI at B LEs.  Assessment/Plan: 3 Days Post-Op Procedure(s) (LRB): POSTERIOR LUMBAR FUSION 1 LEVEL (N/A) INSERTION VENA-CAVA FILTER (N/A) D/c home.    Toni Arthurs 01/13/2013, 8:55 AM

## 2013-01-15 MED FILL — Sodium Chloride IV Soln 0.9%: INTRAVENOUS | Qty: 1000 | Status: AC

## 2013-01-15 MED FILL — Heparin Sodium (Porcine) Inj 1000 Unit/ML: INTRAMUSCULAR | Qty: 30 | Status: AC

## 2013-01-17 NOTE — Progress Notes (Signed)
Late Entry: PT is recommending home with HH and not SNF. Clinical Social Worker will sign off for now as social work intervention is no longer needed. Please consult us again if new need arises.   Anoop Hemmer, MSW [phone] 

## 2013-01-19 NOTE — Discharge Summary (Signed)
Patient ID: Kent Jones. MRN: [id number] DOB/AGE: [birth year] 47 y.o.  Admit date: 01/10/2013 Discharge date: 01/19/2013  Admission Diagnoses:  Active Problems:   * No active hospital problems. *   Discharge Diagnoses:  Active Problems:   * No active hospital problems. *  status post Procedure(s): POSTERIOR LUMBAR FUSION 1 LEVEL INSERTION VENA-CAVA FILTER  Past Medical History  Diagnosis Date  . DVT (deep venous thrombosis)   . Depression   . Hyperlipidemia   . History of kidney stones   . Liver disease     history of temporary elevation of LFTs; no known history of Hepatitis 01/03/13  . Personal history of PE (pulmonary embolism)   . Hypertension     no med at this time  . Peripheral vascular disease     dvt    . GERD (gastroesophageal reflux disease)   . H/O hiatal hernia   . Stones in the urinary tract   . Arthritis     Surgeries: Procedure(s): POSTERIOR LUMBAR FUSION 1 LEVEL INSERTION VENA-CAVA FILTER on 01/10/2013   Consultants:  none  Discharged Condition: Improved  Hospital Course: Kent Jones. is an 47 y.o. male who was admitted 01/10/2013 for operative treatment of <principal problem not specified>. Patient failed conservative treatments (please see the history and physical for the specifics) and had severe unremitting pain that affects sleep, daily activities and work/hobbies. After pre-op clearance, the patient was taken to the operating room on 01/10/2013 and underwent  Procedure(s): POSTERIOR LUMBAR FUSION 1 LEVEL INSERTION VENA-CAVA FILTER.    Patient was given perioperative antibiotics:  Anti-infectives   Start     Dose/Rate Route Frequency Ordered Stop   01/10/13 0600  vancomycin (VANCOCIN) 1,500 mg in sodium chloride 0.9 % 500 mL IVPB     1,500 mg 250 mL/hr over 120 Minutes Intravenous On call to O.R. 01/09/13 1457 01/10/13 0845       Patient was given sequential compression devices and early ambulation to prevent DVT.   Patient  benefited maximally from hospital stay and there were no complications. At the time of discharge, the patient was urinating/moving their bowels without difficulty, tolerating a regular diet, pain is controlled with oral pain medications and they have been cleared by PT/OT.   Recent vital signs: No data found.    Recent laboratory studies: No results found for this basename: WBC, HGB, HCT, PLT, NA, K, CL, CO2, BUN, CREATININE, GLUCOSE, PT, INR, CALCIUM, 2,  in the last 72 hours   Discharge Medications:     Medication List    TAKE these medications       cholecalciferol 1000 UNITS tablet  Commonly known as:  VITAMIN D  Take 1,000 Units by mouth once a week.     DEXILANT 60 MG capsule  Generic drug:  dexlansoprazole  Take 60 mg by mouth daily.     docusate sodium 100 MG capsule  Commonly known as:  COLACE  Take 1 capsule (100 mg total) by mouth 3 (three) times daily as needed for constipation.     methocarbamol 500 MG tablet  Commonly known as:  ROBAXIN  Take 1 tablet (500 mg total) by mouth 3 (three) times daily as needed.     ondansetron 4 MG tablet  Commonly known as:  ZOFRAN  Take 1 tablet (4 mg total) by mouth every 8 (eight) hours as needed for nausea.     Oxycodone HCl 20 MG Tabs  Take 1 tablet (20 mg  total) by mouth every 4 (four) hours as needed for pain.     polyethylene glycol powder powder  Commonly known as:  GLYCOLAX  Take 17 g by mouth daily.     rosuvastatin 10 MG tablet  Commonly known as:  CRESTOR  Take 10 mg by mouth daily.     venlafaxine 75 MG tablet  Commonly known as:  EFFEXOR  Take 75 mg by mouth 2 (two) times daily.     warfarin 5 MG tablet  Commonly known as:  COUMADIN  Take 5 mg by mouth daily.        Diagnostic Studies: Dg Chest 2 View  01/03/2013  *RADIOLOGY REPORT*  Clinical Data: Preadmission film prior to lumbar fusion.  CHEST - 2 VIEW  Comparison: No priors.  Findings: Lung volumes are low.  Subtle linear opacities in the periphery  of the left base are compatible with subsegmental atelectasis or mild scarring.  No consolidative airspace disease. No pleural effusions.  No pneumothorax.  No pulmonary nodule or mass noted.  Pulmonary vasculature and the cardiomediastinal silhouette are within normal limits.  IMPRESSION: 1. Low lung volumes without radiographic evidence of acute cardiopulmonary disease.   Original Report Authenticated By: Trudie Reed, M.D.    Dg Lumbar Spine 2-3 Views  01/12/2013  *RADIOLOGY REPORT*  Clinical Data: Postop fusion.  Mild pain.  LUMBAR SPINE - 2-3 VIEW  Comparison: Intraoperative exam 01/10/2013.  Preoperative exam 01/03/2013.  Findings: Post placement of the interbody spacer L5-S1 level with bilateral pedicle screws and posterior connecting bar place. Minimal anterior slip of L5.  No obvious postoperative complication noted.  Inferior vena cava filter is in place.  Prior surgery with plate and screws overlying the acetabular region on the cone down lateral view.  Inferior vena cava filter is in place.  IMPRESSION: Fusion L5-S1 without complication noted.  Please see above.   Original Report Authenticated By: Lacy Duverney, M.D.    Dg Lumbar Spine 2-3 Views  01/10/2013  *RADIOLOGY REPORT*  Clinical Data: Lumbar fusion.  DG C-ARM GT 120 MIN, LUMBAR SPINE - 2-3 VIEW  Technique: We are provided with two fluoroscopic intraoperative spot views of the lower lumbar spine.  Comparison:  Plain films 01/03/2013.  Findings: Provided images demonstrate pedicle screws and stabilization bars with interbody spacer in place at L5-S1. Anterolisthesis of L5 on S1 is noted. No fracture is identified.  IMPRESSION: L5-S1 fusion.   Original Report Authenticated By: Holley Dexter, M.D.    Dg Lumbar Spine 2-3 Views  01/03/2013  *RADIOLOGY REPORT*  Clinical Data: Preoperative evaluation for spinal fusion.  LUMBAR SPINE - 2-3 VIEW  Comparison: No priors.  Findings: AP and lateral views of the lumbar spine demonstrates no  definite acute displaced fracture or compression type fracture. There is multilevel degenerative disc disease, most severe at L5-S1 where there is advanced loss of intervertebral disc height with associated discogenic changes of the adjacent vertebral body endplates, and 5 mm of anterolisthesis of L5 upon S1. The lack of a lateral spot film of the lumbosacral junction slightly limits assessment.  On the frontal projection, there is incomplete fusion of posterior elements at L5, suggesting spina bifida occulta. Alignment is otherwise anatomic.  IMPRESSION: 1.  Multilevel degenerative disc disease, most severe at L5-S1, as detailed above. 2.  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   Original Report Authenticated By: Trudie Reed, M.D.    Dg Abd 1 View  01/10/2013  *RADIOLOGY REPORT*  Clinical Data:  IVC filter placement  ABDOMEN -  1 VIEW  Comparison: Prior abdominal lumbar spine radiographs 01/03/2013  Findings: Single intraoperative spot radiograph demonstrates an IVC filter projecting over the right aspect of the spine.  The nose cone of the filter projects over the mid L2 vertebral body.  IMPRESSION: An IVC filter projects over the right aspect of the spine.  The nose cone projects over the mid L2 vertebral body.   Original Report Authenticated By: Malachy Moan, M.D.    Dg C-arm Gt 120 Min  01/10/2013  *RADIOLOGY REPORT*  Clinical Data: Lumbar fusion.  DG C-ARM GT 120 MIN, LUMBAR SPINE - 2-3 VIEW  Technique: We are provided with two fluoroscopic intraoperative spot views of the lower lumbar spine.  Comparison:  Plain films 01/03/2013.  Findings: Provided images demonstrate pedicle screws and stabilization bars with interbody spacer in place at L5-S1. Anterolisthesis of L5 on S1 is noted. No fracture is identified.  IMPRESSION: L5-S1 fusion.   Original Report Authenticated By: Holley Dexter, M.D.         Discharge Orders   Future Orders Complete By Expires     Call MD / Call 911  As directed      Comments:      If you experience chest pain or shortness of breath, CALL 911 and be transported to the hospital emergency room.  If you develope a fever above 101 F, pus (white drainage) or increased drainage or redness at the wound, or calf pain, call your surgeon's office.    Constipation Prevention  As directed     Comments:      Drink plenty of fluids.  Prune juice may be helpful.  You may use a stool softener, such as Colace (over the counter) 100 mg twice a day.  Use MiraLax (over the counter) for constipation as needed.    Diet - low sodium heart healthy  As directed     Increase activity slowly as tolerated  As directed        Follow-up Information   Follow up with Alvy Beal, MD In 2 weeks. (If symptoms worsen as needed)    Contact information:   [address], [address] [place] [address] [phone]       Discharge Plan:  discharge to home  Disposition: home.  Doing well.      Signed: Venita Lick D for Dr. Venita Lick [facility] [phone] 01/19/2013, 1:02 PM

## 2014-04-03 ENCOUNTER — Other Ambulatory Visit: Payer: Self-pay | Admitting: Orthopedic Surgery

## 2014-04-03 DIAGNOSIS — M25551 Pain in right hip: Secondary | ICD-10-CM

## 2014-04-19 ENCOUNTER — Ambulatory Visit
Admission: RE | Admit: 2014-04-19 | Discharge: 2014-04-19 | Disposition: A | Discharge status: Home / Self Care | Payer: Worker's Compensation | Source: Ambulatory Visit | Attending: Orthopedic Surgery | Admitting: Orthopedic Surgery

## 2014-04-19 DIAGNOSIS — M25551 Pain in right hip: Secondary | ICD-10-CM

## 2014-04-19 MED ORDER — IOHEXOL 180 MG/ML  SOLN
1.0000 mL | Freq: Once | INTRAMUSCULAR | Status: AC | PRN
  Administered 2014-04-19: 1 mL via INTRA_ARTICULAR

## 2014-04-19 MED ORDER — METHYLPREDNISOLONE ACETATE 40 MG/ML INJ SUSP (RADIOLOG
120.0000 mg | Freq: Once | INTRAMUSCULAR | Status: AC
  Administered 2014-04-19: 120 mg via INTRA_ARTICULAR

## 2014-04-19 MED ORDER — DIAZEPAM 5 MG PO TABS
5.0000 mg | ORAL_TABLET | Freq: Once | ORAL | Status: AC
  Administered 2014-04-19: 5 mg via ORAL

## 2014-07-09 ENCOUNTER — Other Ambulatory Visit: Payer: Self-pay | Admitting: Physical Medicine and Rehabilitation

## 2014-07-09 ENCOUNTER — Other Ambulatory Visit: Payer: Self-pay | Admitting: Orthopedic Surgery

## 2014-07-09 DIAGNOSIS — M961 Postlaminectomy syndrome, not elsewhere classified: Secondary | ICD-10-CM

## 2014-07-26 ENCOUNTER — Other Ambulatory Visit: Payer: Worker's Compensation

## 2014-09-04 IMAGING — RF DG ABDOMEN 1V
1 series · 1 of 1 positions shown · non-contrast
Comparison: Prior abdominal lumbar spine radiographs 01/03/2013

CLINICAL DATA: IVC filter placement

ABDOMEN - 1 VIEW

[Series 1: run · 1 of 1 slices shown]
[im 1/1]
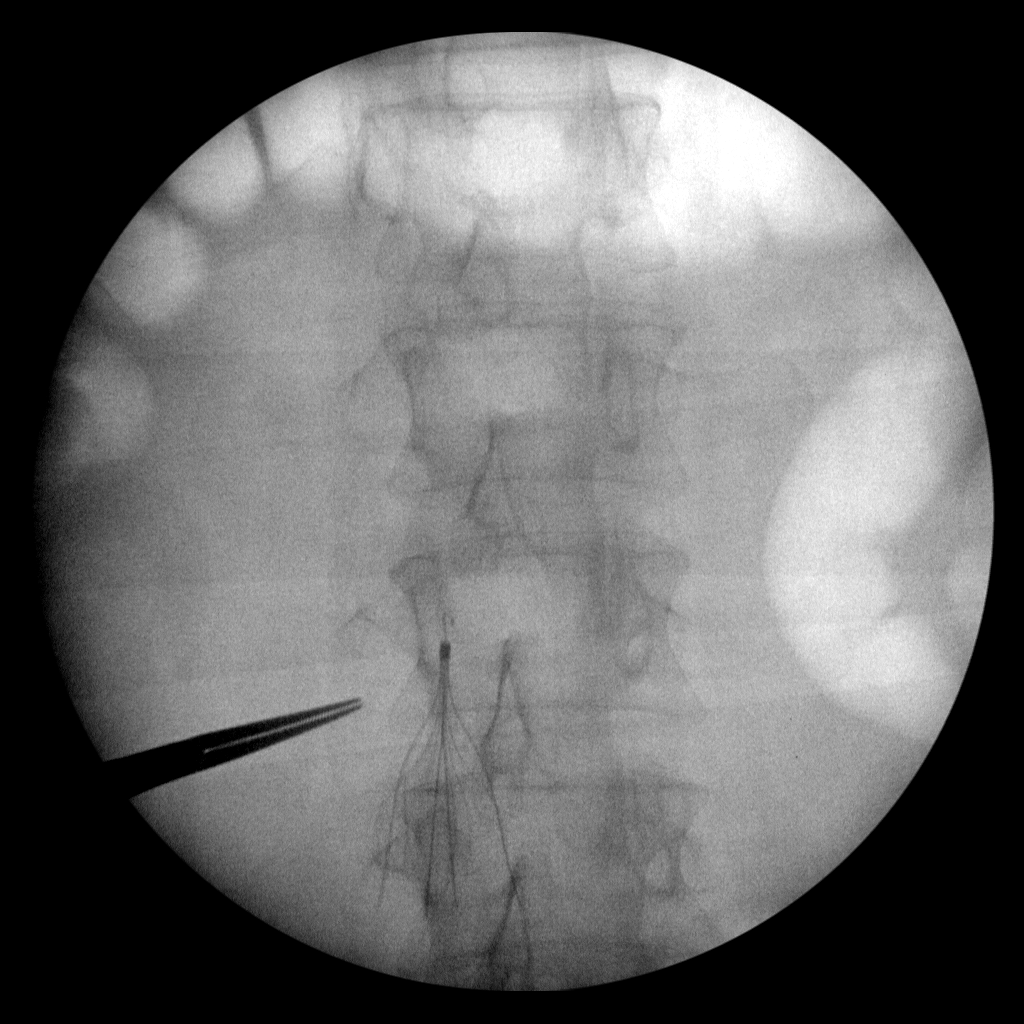

[1 of 1 positions shown; findings below may reference images not displayed]

FINDINGS: Single intraoperative spot radiograph demonstrates an IVC
filter projecting over the right aspect of the spine.  The nose
cone of the filter projects over the mid L2 vertebral body.
IMPRESSION: An IVC filter projects over the right aspect of the spine.  The
nose cone projects over the mid L2 vertebral body.

## 2014-09-06 IMAGING — CR DG LUMBAR SPINE 2-3V
3 series · 3 of 3 positions shown · non-contrast
Comparison: Intraoperative exam 01/10/2013.  Preoperative exam
01/03/2013.

CLINICAL DATA: Postop fusion.  Mild pain.

LUMBAR SPINE - 2-3 VIEW

[t l-spine a.p.]
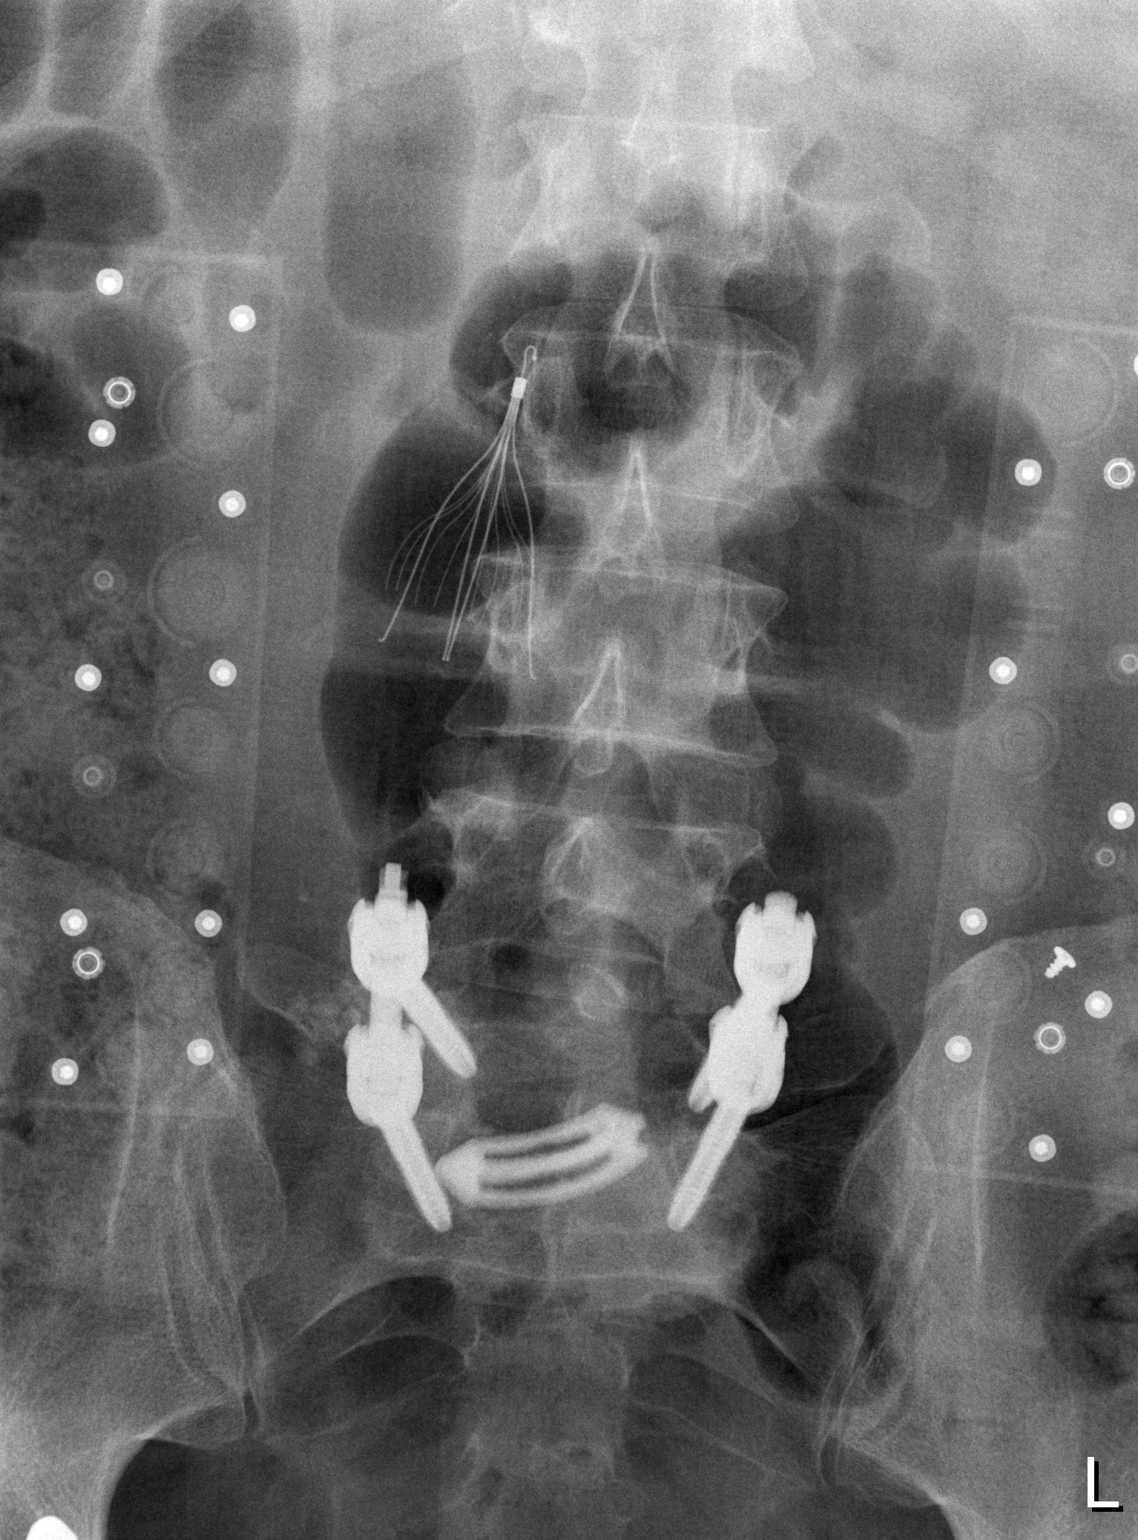

[t l-spine lat *]
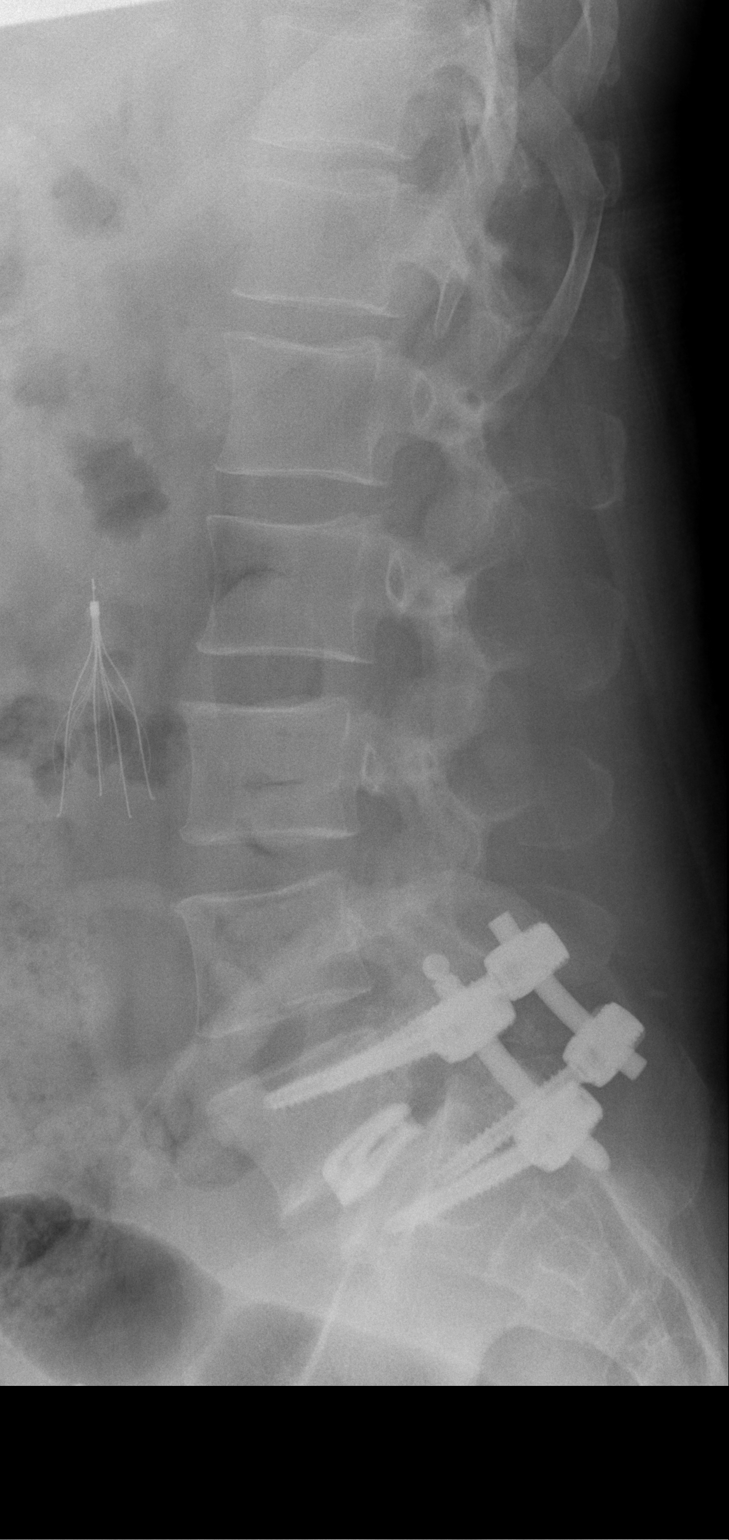

[t l-spine l5-s1 spot]
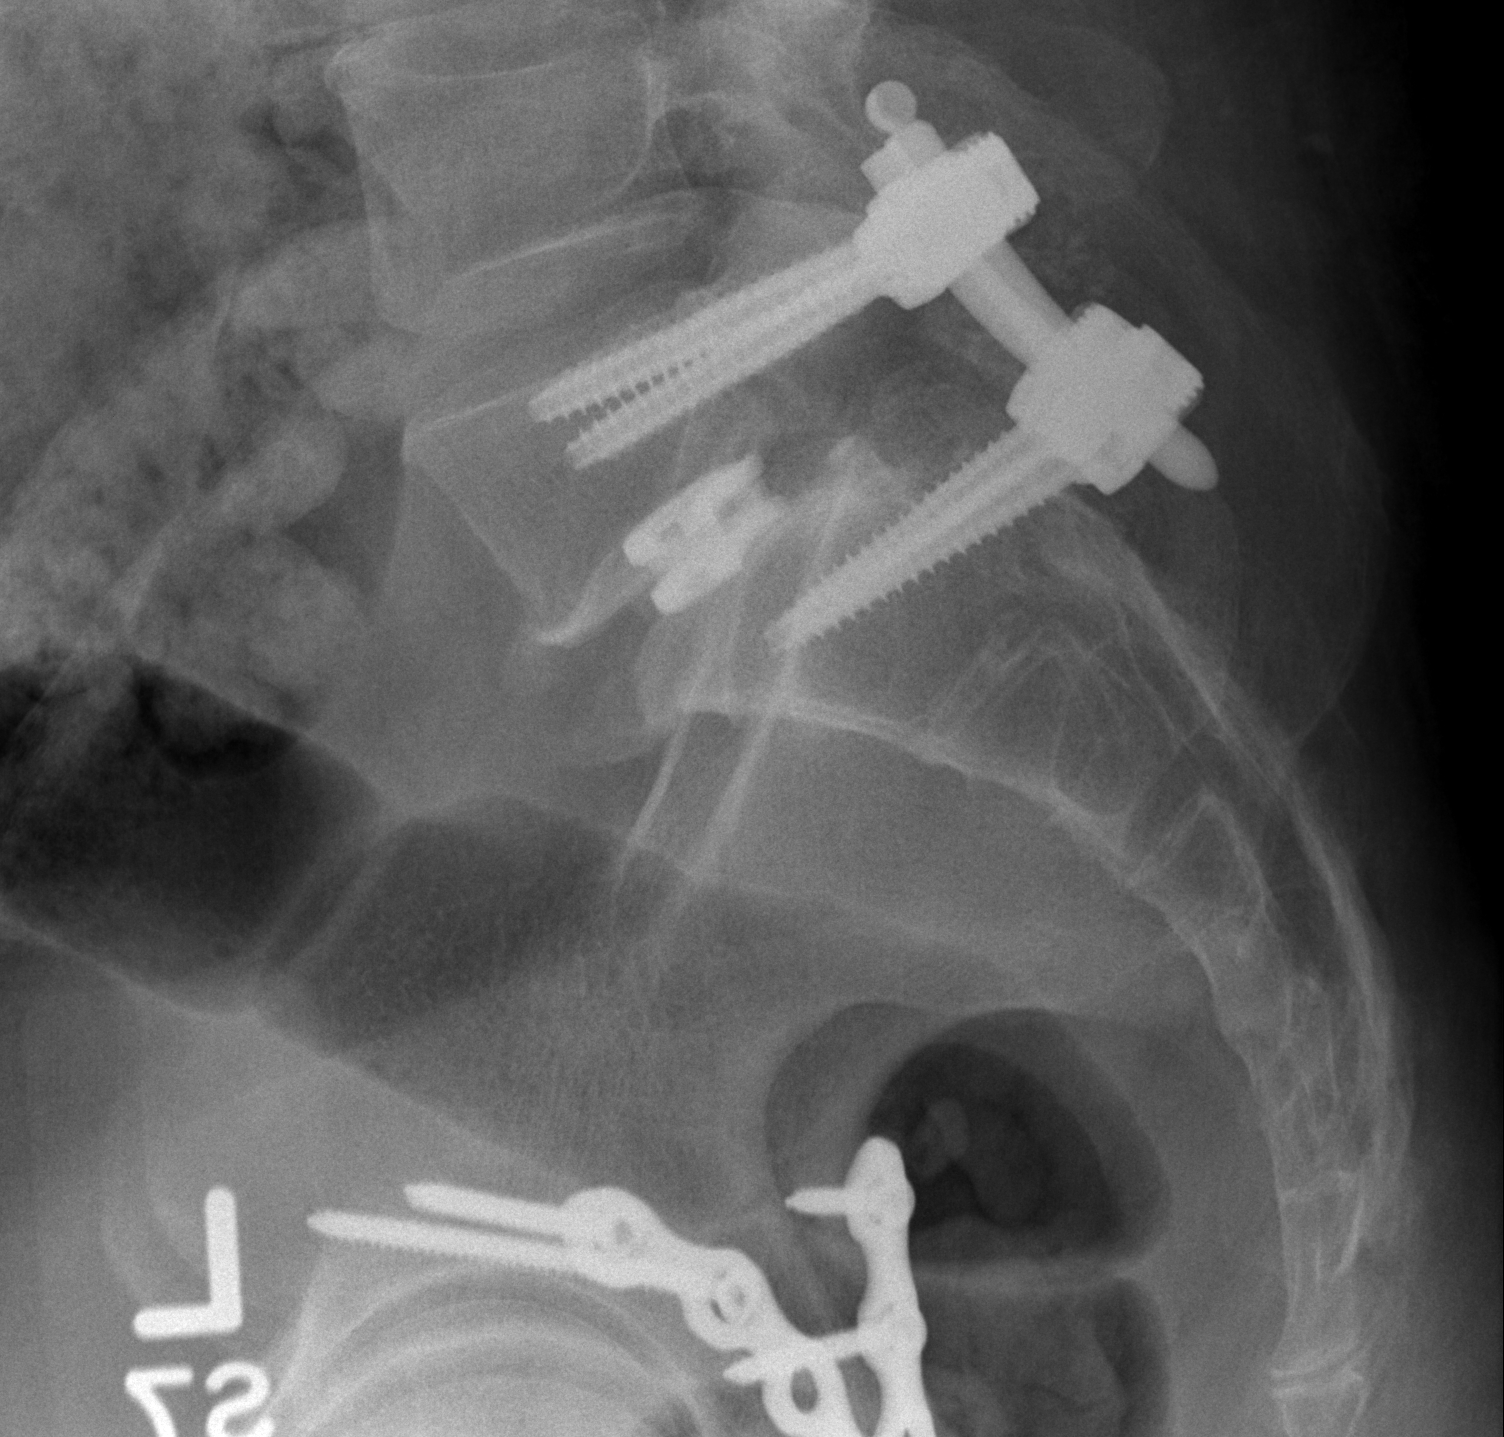

[3 of 3 positions shown; findings below may reference images not displayed]

FINDINGS: Post placement of the interbody spacer L5-S1 level with
bilateral pedicle screws and posterior connecting bar place.
Minimal anterior slip of L5.  No obvious postoperative complication
noted.

Inferior vena cava filter is in place.  Prior surgery with plate
and screws overlying the acetabular region on the cone down lateral
view.  Inferior vena cava filter is in place.
IMPRESSION: Fusion L5-S1 without complication noted.  Please see above.
